# Patient Record
Sex: Female | Born: 1935 | Race: Black or African American | Hispanic: No | Smoking: Never smoker
Health system: Southern US, Community
[De-identification: ages and names within clinical notes are randomized; demographics above are authoritative.]

## PROBLEM LIST (undated history)

## (undated) DIAGNOSIS — I10 Essential (primary) hypertension: Secondary | ICD-10-CM

## (undated) DIAGNOSIS — F0390 Unspecified dementia without behavioral disturbance: Secondary | ICD-10-CM

## (undated) DIAGNOSIS — I509 Heart failure, unspecified: Secondary | ICD-10-CM

## (undated) DIAGNOSIS — E119 Type 2 diabetes mellitus without complications: Secondary | ICD-10-CM

---

## 2008-04-12 ENCOUNTER — Encounter: Admission: RE | Admit: 2008-04-12 | Discharge: 2008-04-12 | Payer: Self-pay | Admitting: Family Medicine

## 2008-09-04 ENCOUNTER — Emergency Department (HOSPITAL_COMMUNITY): Admission: EM | Admit: 2008-09-04 | Discharge: 2008-09-04 | Payer: Self-pay | Admitting: Emergency Medicine

## 2008-10-01 ENCOUNTER — Emergency Department (HOSPITAL_COMMUNITY): Admission: EM | Admit: 2008-10-01 | Discharge: 2008-10-01 | Payer: Self-pay | Admitting: Emergency Medicine

## 2008-11-11 ENCOUNTER — Observation Stay (HOSPITAL_COMMUNITY): Admission: EM | Admit: 2008-11-11 | Discharge: 2008-11-11 | Payer: Self-pay | Admitting: Emergency Medicine

## 2009-11-06 ENCOUNTER — Emergency Department (HOSPITAL_COMMUNITY): Admission: EM | Admit: 2009-11-06 | Discharge: 2009-11-06 | Payer: Self-pay | Admitting: Emergency Medicine

## 2009-11-12 ENCOUNTER — Encounter: Admission: RE | Admit: 2009-11-12 | Discharge: 2009-11-12 | Payer: Self-pay | Admitting: Family Medicine

## 2010-03-19 LAB — POCT I-STAT, CHEM 8
BUN: 13 mg/dL (ref 6–23)
Chloride: 100 mEq/L (ref 96–112)
Creatinine, Ser: 0.8 mg/dL (ref 0.4–1.2)
Glucose, Bld: 150 mg/dL — ABNORMAL HIGH (ref 70–99)
HCT: 41 % (ref 36.0–46.0)
Potassium: 3.3 mEq/L — ABNORMAL LOW (ref 3.5–5.1)

## 2010-04-10 LAB — HEMOGLOBIN A1C: Mean Plasma Glucose: 180 mg/dL

## 2010-04-10 LAB — POCT I-STAT, CHEM 8
Creatinine, Ser: 0.7 mg/dL (ref 0.4–1.2)
Glucose, Bld: 199 mg/dL — ABNORMAL HIGH (ref 70–99)
Hemoglobin: 13.6 g/dL (ref 12.0–15.0)
Sodium: 137 mEq/L (ref 135–145)
TCO2: 26 mmol/L (ref 0–100)

## 2010-04-10 LAB — BLOOD GAS, ARTERIAL
Acid-Base Excess: 1.1 mmol/L (ref 0.0–2.0)
Drawn by: 232811
O2 Content: 2 L/min
pCO2 arterial: 44.7 mmHg (ref 35.0–45.0)

## 2011-10-03 ENCOUNTER — Other Ambulatory Visit: Payer: Self-pay | Admitting: Family Medicine

## 2011-10-03 DIAGNOSIS — H579 Unspecified disorder of eye and adnexa: Secondary | ICD-10-CM

## 2011-10-09 ENCOUNTER — Inpatient Hospital Stay: Admission: RE | Admit: 2011-10-09 | Payer: Self-pay | Source: Ambulatory Visit

## 2011-12-03 ENCOUNTER — Ambulatory Visit
Admission: RE | Admit: 2011-12-03 | Discharge: 2011-12-03 | Disposition: A | Discharge status: Home / Self Care | Payer: No Typology Code available for payment source | Source: Ambulatory Visit | Attending: Family Medicine | Admitting: Family Medicine

## 2011-12-03 DIAGNOSIS — H579 Unspecified disorder of eye and adnexa: Secondary | ICD-10-CM

## 2011-12-17 ENCOUNTER — Other Ambulatory Visit: Payer: Self-pay | Admitting: Family Medicine

## 2011-12-17 ENCOUNTER — Other Ambulatory Visit: Payer: Self-pay

## 2011-12-17 ENCOUNTER — Ambulatory Visit
Admission: RE | Admit: 2011-12-17 | Discharge: 2011-12-17 | Disposition: A | Discharge status: Home / Self Care | Payer: No Typology Code available for payment source | Source: Ambulatory Visit | Attending: Family Medicine | Admitting: Family Medicine

## 2011-12-17 ENCOUNTER — Ambulatory Visit: Admission: RE | Admit: 2011-12-17 | Payer: No Typology Code available for payment source | Source: Ambulatory Visit

## 2011-12-17 DIAGNOSIS — H579 Unspecified disorder of eye and adnexa: Secondary | ICD-10-CM

## 2012-01-01 ENCOUNTER — Other Ambulatory Visit: Payer: Self-pay | Admitting: Ophthalmology

## 2015-05-21 ENCOUNTER — Other Ambulatory Visit: Payer: Self-pay | Admitting: Nurse Practitioner

## 2015-05-21 ENCOUNTER — Ambulatory Visit
Admission: RE | Admit: 2015-05-21 | Discharge: 2015-05-21 | Disposition: A | Discharge status: Home / Self Care | Payer: No Typology Code available for payment source | Source: Ambulatory Visit | Attending: Nurse Practitioner | Admitting: Nurse Practitioner

## 2015-05-21 DIAGNOSIS — M25532 Pain in left wrist: Secondary | ICD-10-CM

## 2016-10-24 ENCOUNTER — Encounter (HOSPITAL_COMMUNITY): Payer: Self-pay | Admitting: Emergency Medicine

## 2016-10-24 ENCOUNTER — Emergency Department (HOSPITAL_COMMUNITY)
Admission: EM | Admit: 2016-10-24 | Discharge: 2016-10-25 | Disposition: A | Discharge status: Home / Self Care | Payer: Medicaid Other | Attending: Emergency Medicine | Admitting: Emergency Medicine

## 2016-10-24 DIAGNOSIS — Z5321 Procedure and treatment not carried out due to patient leaving prior to being seen by health care provider: Secondary | ICD-10-CM | POA: Insufficient documentation

## 2016-10-24 DIAGNOSIS — Z041 Encounter for examination and observation following transport accident: Secondary | ICD-10-CM | POA: Insufficient documentation

## 2016-10-24 HISTORY — DX: Type 2 diabetes mellitus without complications: E11.9

## 2016-10-24 HISTORY — DX: Essential (primary) hypertension: I10

## 2016-10-24 HISTORY — DX: Unspecified dementia, unspecified severity, without behavioral disturbance, psychotic disturbance, mood disturbance, and anxiety: F03.90
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
## 2016-10-25 NOTE — ED Notes (Signed)
Pt not in lobby.  

## 2017-05-28 ENCOUNTER — Ambulatory Visit
Admission: RE | Admit: 2017-05-28 | Discharge: 2017-05-28 | Disposition: A | Discharge status: Home / Self Care | Payer: No Typology Code available for payment source | Source: Ambulatory Visit | Attending: Family Medicine | Admitting: Family Medicine

## 2017-05-28 ENCOUNTER — Other Ambulatory Visit: Payer: Self-pay

## 2017-05-28 ENCOUNTER — Other Ambulatory Visit: Payer: Self-pay | Admitting: Family Medicine

## 2017-05-28 DIAGNOSIS — R0602 Shortness of breath: Secondary | ICD-10-CM

## 2017-05-29 ENCOUNTER — Observation Stay (HOSPITAL_COMMUNITY)
Admission: EM | Admit: 2017-05-29 | Discharge: 2017-05-31 | Disposition: A | Discharge status: Home / Self Care | Payer: Medicare (Managed Care) | Attending: Internal Medicine | Admitting: Internal Medicine

## 2017-05-29 ENCOUNTER — Emergency Department (HOSPITAL_COMMUNITY): Payer: Medicare (Managed Care)

## 2017-05-29 ENCOUNTER — Other Ambulatory Visit: Payer: Self-pay

## 2017-05-29 ENCOUNTER — Encounter (HOSPITAL_COMMUNITY): Payer: Self-pay | Admitting: Emergency Medicine

## 2017-05-29 DIAGNOSIS — B9620 Unspecified Escherichia coli [E. coli] as the cause of diseases classified elsewhere: Secondary | ICD-10-CM | POA: Insufficient documentation

## 2017-05-29 DIAGNOSIS — I5033 Acute on chronic diastolic (congestive) heart failure: Secondary | ICD-10-CM | POA: Insufficient documentation

## 2017-05-29 DIAGNOSIS — J181 Lobar pneumonia, unspecified organism: Secondary | ICD-10-CM | POA: Diagnosis not present

## 2017-05-29 DIAGNOSIS — J90 Pleural effusion, not elsewhere classified: Secondary | ICD-10-CM | POA: Insufficient documentation

## 2017-05-29 DIAGNOSIS — E1159 Type 2 diabetes mellitus with other circulatory complications: Secondary | ICD-10-CM

## 2017-05-29 DIAGNOSIS — Z79899 Other long term (current) drug therapy: Secondary | ICD-10-CM | POA: Insufficient documentation

## 2017-05-29 DIAGNOSIS — I10 Essential (primary) hypertension: Secondary | ICD-10-CM

## 2017-05-29 DIAGNOSIS — R0902 Hypoxemia: Secondary | ICD-10-CM | POA: Diagnosis not present

## 2017-05-29 DIAGNOSIS — R509 Fever, unspecified: Secondary | ICD-10-CM | POA: Diagnosis present

## 2017-05-29 DIAGNOSIS — I110 Hypertensive heart disease with heart failure: Secondary | ICD-10-CM | POA: Insufficient documentation

## 2017-05-29 DIAGNOSIS — D638 Anemia in other chronic diseases classified elsewhere: Secondary | ICD-10-CM | POA: Insufficient documentation

## 2017-05-29 DIAGNOSIS — R0602 Shortness of breath: Secondary | ICD-10-CM

## 2017-05-29 DIAGNOSIS — E119 Type 2 diabetes mellitus without complications: Secondary | ICD-10-CM

## 2017-05-29 DIAGNOSIS — R0600 Dyspnea, unspecified: Secondary | ICD-10-CM

## 2017-05-29 DIAGNOSIS — R262 Difficulty in walking, not elsewhere classified: Secondary | ICD-10-CM | POA: Insufficient documentation

## 2017-05-29 DIAGNOSIS — R0603 Acute respiratory distress: Secondary | ICD-10-CM | POA: Diagnosis present

## 2017-05-29 DIAGNOSIS — N390 Urinary tract infection, site not specified: Secondary | ICD-10-CM | POA: Diagnosis not present

## 2017-05-29 DIAGNOSIS — F0390 Unspecified dementia without behavioral disturbance: Secondary | ICD-10-CM | POA: Diagnosis not present

## 2017-05-29 DIAGNOSIS — F0150 Vascular dementia without behavioral disturbance: Secondary | ICD-10-CM | POA: Diagnosis not present

## 2017-05-29 DIAGNOSIS — I509 Heart failure, unspecified: Secondary | ICD-10-CM

## 2017-05-29 DIAGNOSIS — J209 Acute bronchitis, unspecified: Principal | ICD-10-CM | POA: Insufficient documentation

## 2017-05-29 HISTORY — DX: Acute respiratory distress: R06.03

## 2017-05-29 HISTORY — DX: Heart failure, unspecified: I50.9

## 2017-05-29 LAB — CBC WITH DIFFERENTIAL/PLATELET
ABS IMMATURE GRANULOCYTES: 0 10*3/uL (ref 0.0–0.1)
BASOS ABS: 0 10*3/uL (ref 0.0–0.1)
Basophils Relative: 0 %
EOS PCT: 1 %
Eosinophils Absolute: 0.1 10*3/uL (ref 0.0–0.7)
HEMATOCRIT: 32.2 % — AB (ref 36.0–46.0)
HEMOGLOBIN: 10.1 g/dL — AB (ref 12.0–15.0)
Immature Granulocytes: 1 %
LYMPHS PCT: 18 %
Lymphs Abs: 1.1 10*3/uL (ref 0.7–4.0)
MCH: 25.5 pg — AB (ref 26.0–34.0)
MCHC: 31.4 g/dL (ref 30.0–36.0)
MCV: 81.3 fL (ref 78.0–100.0)
MONO ABS: 1.3 10*3/uL — AB (ref 0.1–1.0)
MONOS PCT: 21 %
NEUTROS ABS: 3.6 10*3/uL (ref 1.7–7.7)
Neutrophils Relative %: 59 %
Platelets: 152 10*3/uL (ref 150–400)
RBC: 3.96 MIL/uL (ref 3.87–5.11)
RDW: 14.9 % (ref 11.5–15.5)
WBC: 6 10*3/uL (ref 4.0–10.5)

## 2017-05-29 LAB — URINALYSIS, ROUTINE W REFLEX MICROSCOPIC
BILIRUBIN URINE: NEGATIVE
Glucose, UA: NEGATIVE mg/dL
KETONES UR: NEGATIVE mg/dL
Nitrite: NEGATIVE
PROTEIN: NEGATIVE mg/dL
SPECIFIC GRAVITY, URINE: 1.018 (ref 1.005–1.030)
pH: 5 (ref 5.0–8.0)

## 2017-05-29 LAB — COMPREHENSIVE METABOLIC PANEL
ALBUMIN: 3 g/dL — AB (ref 3.5–5.0)
ALT: 7 U/L — AB (ref 14–54)
AST: 18 U/L (ref 15–41)
Alkaline Phosphatase: 60 U/L (ref 38–126)
Anion gap: 7 (ref 5–15)
BILIRUBIN TOTAL: 0.9 mg/dL (ref 0.3–1.2)
BUN: 13 mg/dL (ref 6–20)
CHLORIDE: 106 mmol/L (ref 101–111)
CO2: 28 mmol/L (ref 22–32)
CREATININE: 1.01 mg/dL — AB (ref 0.44–1.00)
Calcium: 8.2 mg/dL — ABNORMAL LOW (ref 8.9–10.3)
GFR calc Af Amer: 58 mL/min — ABNORMAL LOW (ref >60)
GFR, EST NON AFRICAN AMERICAN: 50 mL/min — AB (ref >60)
GLUCOSE: 121 mg/dL — AB (ref 65–99)
POTASSIUM: 3.8 mmol/L (ref 3.5–5.1)
Sodium: 141 mmol/L (ref 135–145)
Total Protein: 7 g/dL (ref 6.5–8.1)

## 2017-05-29 LAB — TROPONIN I

## 2017-05-29 LAB — PROCALCITONIN: Procalcitonin: <0.1 ng/mL

## 2017-05-29 LAB — INFLUENZA PANEL BY PCR (TYPE A & B)
INFLBPCR: NEGATIVE
Influenza A By PCR: NEGATIVE

## 2017-05-29 LAB — I-STAT CG4 LACTIC ACID, ED: LACTIC ACID, VENOUS: 0.66 mmol/L (ref 0.5–1.9)

## 2017-05-29 LAB — PROTIME-INR
INR: 1.19
Prothrombin Time: 15 seconds (ref 11.4–15.2)

## 2017-05-29 LAB — CBG MONITORING, ED
GLUCOSE-CAPILLARY: 116 mg/dL — AB (ref 65–99)
GLUCOSE-CAPILLARY: 91 mg/dL (ref 65–99)

## 2017-05-29 LAB — GLUCOSE, CAPILLARY: Glucose-Capillary: 124 mg/dL — ABNORMAL HIGH (ref 65–99)

## 2017-05-29 LAB — BRAIN NATRIURETIC PEPTIDE: B NATRIURETIC PEPTIDE 5: 65.8 pg/mL (ref 0.0–100.0)

## 2017-05-29 MED ORDER — LORAZEPAM 1 MG PO TABS
1.0000 mg | ORAL_TABLET | Freq: Every day | ORAL | Status: DC
  Administered 2017-05-29 – 2017-05-30 (×2): 1 mg via ORAL
  Filled 2017-05-29 (×2): qty 1

## 2017-05-29 MED ORDER — SODIUM CHLORIDE 0.9 % IV SOLN
2.0000 g | INTRAVENOUS | Status: DC
  Administered 2017-05-29: 2 g via INTRAVENOUS
  Filled 2017-05-29: qty 2

## 2017-05-29 MED ORDER — IPRATROPIUM-ALBUTEROL 0.5-2.5 (3) MG/3ML IN SOLN: 3.0000 mL | Freq: Four times a day (QID) | RESPIRATORY_TRACT | Status: DC | PRN

## 2017-05-29 MED ORDER — ACETAMINOPHEN 325 MG PO TABS: 650.0000 mg | ORAL_TABLET | Freq: Four times a day (QID) | ORAL | Status: DC | PRN

## 2017-05-29 MED ORDER — ONDANSETRON HCL 4 MG/2ML IJ SOLN: 4.0000 mg | Freq: Four times a day (QID) | INTRAMUSCULAR | Status: DC | PRN

## 2017-05-29 MED ORDER — ONDANSETRON HCL 4 MG PO TABS: 4.0000 mg | ORAL_TABLET | Freq: Four times a day (QID) | ORAL | Status: DC | PRN

## 2017-05-29 MED ORDER — ACETAMINOPHEN 650 MG RE SUPP: 650.0000 mg | Freq: Four times a day (QID) | RECTAL | Status: DC | PRN

## 2017-05-29 MED ORDER — FUROSEMIDE 20 MG PO TABS
20.0000 mg | ORAL_TABLET | Freq: Every day | ORAL | Status: DC
  Administered 2017-05-30 – 2017-05-31 (×2): 20 mg via ORAL
  Filled 2017-05-29 (×2): qty 1

## 2017-05-29 MED ORDER — SALINE SPRAY 0.65 % NA SOLN
1.0000 | Freq: Two times a day (BID) | NASAL | Status: DC
  Administered 2017-05-29 – 2017-05-31 (×4): 1 via NASAL
  Filled 2017-05-29 (×2): qty 44

## 2017-05-29 MED ORDER — DOXYCYCLINE HYCLATE 100 MG PO TABS
100.0000 mg | ORAL_TABLET | Freq: Two times a day (BID) | ORAL | Status: DC
  Administered 2017-05-30: 100 mg via ORAL
  Filled 2017-05-29: qty 1

## 2017-05-29 MED ORDER — IPRATROPIUM-ALBUTEROL 0.5-2.5 (3) MG/3ML IN SOLN
3.0000 mL | Freq: Once | RESPIRATORY_TRACT | Status: AC
  Administered 2017-05-29: 3 mL via RESPIRATORY_TRACT
  Filled 2017-05-29: qty 3

## 2017-05-29 MED ORDER — INSULIN ASPART 100 UNIT/ML ~~LOC~~ SOLN: 0.0000 [IU] | Freq: Three times a day (TID) | SUBCUTANEOUS | Status: DC

## 2017-05-29 MED ORDER — ALBUTEROL SULFATE (2.5 MG/3ML) 0.083% IN NEBU: 2.5000 mg | INHALATION_SOLUTION | RESPIRATORY_TRACT | Status: DC | PRN

## 2017-05-29 MED ORDER — SENNOSIDES-DOCUSATE SODIUM 8.6-50 MG PO TABS: 1.0000 | ORAL_TABLET | Freq: Every evening | ORAL | Status: DC | PRN

## 2017-05-29 MED ORDER — GUAIFENESIN ER 600 MG PO TB12: 600.0000 mg | ORAL_TABLET | Freq: Two times a day (BID) | ORAL | Status: DC | PRN

## 2017-05-29 MED ORDER — ENOXAPARIN SODIUM 40 MG/0.4ML ~~LOC~~ SOLN
40.0000 mg | Freq: Every day | SUBCUTANEOUS | Status: DC
  Administered 2017-05-29 – 2017-05-31 (×3): 40 mg via SUBCUTANEOUS
  Filled 2017-05-29 (×4): qty 0.4

## 2017-05-29 MED ORDER — BISACODYL 10 MG RE SUPP: 10.0000 mg | Freq: Every day | RECTAL | Status: DC | PRN

## 2017-05-29 MED ORDER — BENAZEPRIL HCL 5 MG PO TABS
20.0000 mg | ORAL_TABLET | Freq: Every day | ORAL | Status: DC
  Administered 2017-05-30 – 2017-05-31 (×2): 20 mg via ORAL
  Filled 2017-05-29 (×2): qty 4

## 2017-05-29 MED ORDER — CITALOPRAM HYDROBROMIDE 20 MG PO TABS
20.0000 mg | ORAL_TABLET | Freq: Every day | ORAL | Status: DC
  Administered 2017-05-29 – 2017-05-31 (×3): 20 mg via ORAL
  Filled 2017-05-29 (×3): qty 1

## 2017-05-29 MED ORDER — SODIUM CHLORIDE 0.9 % IV SOLN: INTRAVENOUS | Status: DC

## 2017-05-29 MED ORDER — VANCOMYCIN HCL 10 G IV SOLR
1500.0000 mg | Freq: Once | INTRAVENOUS | Status: AC
  Administered 2017-05-29: 1500 mg via INTRAVENOUS
  Filled 2017-05-29: qty 1500

## 2017-05-29 MED ORDER — VANCOMYCIN HCL IN DEXTROSE 1-5 GM/200ML-% IV SOLN: 1000.0000 mg | INTRAVENOUS | Status: DC

## 2017-05-29 NOTE — ED Notes (Signed)
Mittens placed on hands to discourage pulling of lines, IV. Pt calm, resting at present.

## 2017-05-29 NOTE — ED Notes (Signed)
Johny Chess (daughter) [phone] - contact first Synthia Fairbank (nephew) [phone] Please contact with any questions

## 2017-05-29 NOTE — Evaluation (Signed)
Physical Therapy Evaluation Patient Details Name: Amanda Stanton MRN: [id number] DOB: [birth year] Today's Date: 05/29/2017   History of Present Illness  Amanda Stanton is an 84yo black female who comes to [facility] on 5/23 after noted increased WOB by family as well as subjective fever. In ED noted febrile. PMH: advanced dementia language deficits, CHF, HTN. Pt has been a [facility] participant for several years, there 5D/week, still AMB home and stairs ad lib without supervision at home to get to second floor bedroom.    Clinical Impression  Pt admitted with above diagnosis. Pt currently with functional limitations due to the deficits listed below (see "PT Problem List"). Patient familiar to author from [facility] 5YA. Upon entry, the patient is received semirecumbent in bed, asleep, easily awakened with gentle touch. No family/caregiver present, but daughter is reach via telephone who provides details regarding patient history. Pt has severe language deficits at baseline, today responds well to physical initiation, tactile and visual cues for communication; minimal but intermittent eye contact and smiling, not observed grimacing at anytime. Pt participates with bed mobility and transfers with minA or less, mostly tactile cuing, appears more effortful than her described baseline. AMB /stairs not pursued d/t DB drop supine to sitting. Pt has adequate caregiver services at home and in community and would thrive there to address acute deficits: ability to access her 2nd story bedroom and bath is the biggest limiting factor and rationale for SNF recommendations at DC. Pt will benefit from skilled PT intervention to increase independence and safety with basic mobility in preparation for discharge to the venue listed below.       Follow Up Recommendations Supervision/Assistance - 24 hour;Supervision for mobility/OOB;SNF(Pt's bed/bath is on 2nd floor, full flight. This will dictate DC location more than  anything, otherwise would thrive with home/PACE)    Equipment Recommendations  None recommended by PT    Recommendations for Other Services       Precautions / Restrictions Precautions Precautions: Fall Precaution Comments: advanced dementia at baseline Restrictions Weight Bearing Restrictions: No      Mobility  Bed Mobility Overal bed mobility: Needs Assistance Bed Mobility: Supine to Sit;Sit to Supine     Supine to sit: Min assist Sit to supine: Min assist   General bed mobility comments: requires iniitation of movement for communication purposes, but then becomes engaged adn moves well, some weakness, additional effort noted.   Transfers Overall transfer level: Needs assistance Equipment used: None Transfers: Sit to/from Stand Sit to Stand: Min assist;Min guard(doesn't rise with 2-hand hodl assist, but with dependent transfer postutring rises easily, no significan tLOB. )         General transfer comment: requires iniitation of movement for communication purposes, but then becomes engaged adn moves well, some weakness, additional effort noted.   Ambulation/Gait Ambulation/Gait assistance: (not attempted; BP drop from supine to sitting EOB. )              Stairs            Wheelchair Mobility    Modified Rankin (Stroke Patients Only)       Balance Overall balance assessment: Mild deficits observed, not formally tested;Needs assistance                                           Pertinent Vitals/Pain Pain Assessment: Faces Faces Pain Scale: Hurts  a little bit(appears gebneraally malaised, returns covers after PT removes them. ) Pain Intervention(s): Limited activity within patient's tolerance;Monitored during session;Repositioned    Home Living Family/patient expects to be discharged to:: Private residence Living Arrangements: Children Available Help at Discharge: Family(Pt is a PACE participant M-F, has an aide M-F in home  3-7pm, aide Sat 8a-10a, 1p-5p, Sun 8a-12p   ) Type of Home: Apartment Home Access: Level entry     Home Layout: Two level Home Equipment: Shower seat - built in      Prior Function           Comments: limited community AMB without AD, climbs a flight of stairs ad lib; working with PT at [employer]; requires assistance with bathing, dressing, toiletting. Can self feed. Significant communication deficits.      Hand Dominance        Extremity/Trunk Assessment   Upper Extremity Assessment Upper Extremity Assessment: Generalized weakness    Lower Extremity Assessment Lower Extremity Assessment: Generalized weakness       Communication   Communication: Receptive difficulties;Expressive difficulties  Cognition Arousal/Alertness: Awake/alert Behavior During Therapy: WFL for tasks assessed/performed Overall Cognitive Status: History of cognitive impairments - at baseline(severe languarge deficits, reponds well to tactile consoling, and physical cues to inititate activity, some visual cues helpful <25% of times. )                                        General Comments      Exercises     Assessment/Plan    PT Assessment Patient needs continued PT services  PT Problem List Decreased strength;Decreased activity tolerance;Decreased mobility       PT Treatment Interventions Therapeutic activities    PT Goals (Current goals can be found in the Care Plan section)  Acute Rehab PT Goals PT Goal Formulation: Patient unable to participate in goal setting Time For Goal Achievement: 06/12/17    Frequency Min 2X/week   Barriers to discharge   pt has caregiver support nearly if not 24/7    Co-evaluation               AM-PAC PT "6 Clicks" Daily Activity  Outcome Measure Difficulty turning over in bed (including adjusting bedclothes, sheets and blankets)?: A Lot Difficulty moving from lying on back to sitting on the side of the bed? : A Lot Difficulty  sitting down on and standing up from a chair with arms (e.g., wheelchair, bedside commode, etc,.)?: Unable Help needed moving to and from a bed to chair (including a wheelchair)?: A Lot Help needed walking in hospital room?: A Lot Help needed climbing 3-5 steps with a railing? : Total 6 Click Score: 10    End of Session Equipment Utilized During Treatment: (pt doffing nasal canual) Activity Tolerance: Patient tolerated treatment well;Patient limited by fatigue;Treatment limited secondary to medical complications (Comment)(BP drop to 73/62 at EOB) Patient left: in bed;with bed alarm set Nurse Communication: Mobility status(baseline funcitonal status) PT Visit Diagnosis: Difficulty in walking, not elsewhere classified (R26.2);Muscle weakness (generalized) (M62.81)    Time: 0981-1914 PT Time Calculation (min) (ACUTE ONLY): 24 min   Charges:   PT Evaluation $PT Eval Moderate Complexity: 1 Mod PT Treatments $Therapeutic Activity: 8-22 mins   PT G Codes:        2:57 PM, 2017-06-24 Rosamaria Lints, PT, DPT Physical Therapist - [place] [phone] (Pager)  [phone] (Office)  Kariana Wiles C 05/29/2017, 2:52 PM

## 2017-05-29 NOTE — ED Notes (Signed)
Attempted to call patient's daughter [name] to update her that patient will be moving to [facility], no answer at number provided, HIPPA compliant voicemail left for pts daughter to contact this RN.

## 2017-05-29 NOTE — H&P (Addendum)
History and Physical    Amanda Stanton ZOX:[id number] DOB: 1931-05-03 DOA: 05/29/2017   PCP: Jethro Bastos, MD   Patient coming from:  Home    Chief Complaint: Shortness of breath   HPI: Amanda Stanton is a 84 y.o. female with medical history significant for dementia, congestive heart failure, hypertension, dementia, withbaseline confusion, brought by EMS with increasing shortness of breath.  She is level 5 caveat due to increased confusion, unable to follow commands or verbalize symptoms.  History is obtained by daughter, who reports that, over the last several days, the patient was having increased shortness of breath, and nonproductive cough.  At home, she was found to have a fever of 102.9, with similar values at the ED.  Daughter denies the patient having any sick contacts.  She did not appear to have any chills or night sweats.  She did have mild decrease oral intake this week.  Denies any nausea or vomiting.  No diarrhea was noted.  Daughter denies the patient having any decreased urine output, or lower extremity swelling.   ED Course:  BP 102/60   Pulse 89   Temp (!) 100.7 F (38.2 C) (Axillary)   Resp 20   Ht  (1.6 m)   Wt 68 kg (150 lb)   SpO2 93%   BMI 26.57 kg/m   Chest x-ray shows cardiomegaly with vascular congestion and interstitial opacity suspicious for minimal edema. Sodium 141, potassium 3.8, bicarb 28, glucose 121, BUN 13, creatinine 1.01, calcium 8.2, LFTs normal. BNP 65.8, lactic acid 0.66 White count 6, hemoglobin 10.1, platelets 152 PT 15, INR 1.19 Cultures pending Patient was placed on Vanco and Maxipime per pharmacy  Review of Systems:  As per HPI otherwise all other systems reviewed and are negative as reported by daughter due to Level 5 caveat due to dementia   Past Medical History:  Diagnosis Date  . CHF (congestive heart failure) (HCC)   . Dementia   . Diabetes mellitus without complication (HCC)   . Hypertension     History reviewed. No  pertinent surgical history.  Social History Social History   Socioeconomic History  . Marital status: Divorced    Spouse name: Not on file  . Number of children: Not on file  . Years of education: Not on file  . Highest education level: Not on file  Occupational History  . Not on file  Social Needs  . Financial resource strain: Not on file  . Food insecurity:    Worry: Not on file    Inability: Not on file  . Transportation needs:    Medical: Not on file    Non-medical: Not on file  Tobacco Use  . Smoking status: Never Smoker  . Smokeless tobacco: Never Used  Substance and Sexual Activity  . Alcohol use: No  . Drug use: Not on file  . Sexual activity: Not on file  Lifestyle  . Physical activity:    Days per week: Not on file    Minutes per session: Not on file  . Stress: Not on file  Relationships  . Social connections:    Talks on phone: Not on file    Gets together: Not on file    Attends religious service: Not on file    Active member of club or organization: Not on file    Attends meetings of clubs or organizations: Not on file    Relationship status: Not on file  . Intimate partner violence:  Fear of current or ex partner: Not on file    Emotionally abused: Not on file    Physically abused: Not on file    Forced sexual activity: Not on file  Other Topics Concern  . Not on file  Social History Narrative  . Not on file     No Known Allergies  History reviewed. No pertinent family history.    Prior to Admission medications   Not on File    Physical Exam:  Vitals:   05/29/17 0645 05/29/17 0706 05/29/17 0708 05/29/17 0708  BP: 102/63  102/60   Pulse: 85 88 89   Resp:  20    Temp:    (!) 100.7 F (38.2 C)  TempSrc:    Axillary  SpO2: 95% 97% 93%   Weight:      Height:       Constitutional: NAD, calm, comfortable,  Eyes: PERRL, lids and conjunctivae normal ENMT: Mucous membranes are moist, without exudate or lesions. edentulous Neck:  normal, supple, no masses, no thyromegaly Respiratory:   Slight decreased breath sounds at the bases, no wheezing,  Normal respiratory effort  Cardiovascular: Regular rate and rhythm, 2/6  murmur, rubs or gallops. No extremity edema. 2+ pedal pulses. No carotid bruits.  Abdomen: Soft, non tender, No hepatosplenomegaly. Bowel sounds positive.  Musculoskeletal: no clubbing / cyanosis. Moves all extremities. Wearing mittens  Skin: no jaundice, No lesions.  Neurologic: Sensation intact  Strength unable to be tested due to dementia  Psychiatric:   Awake, known dementia, not following commands     Labs on Admission: I have personally reviewed following labs and imaging studies  CBC: Recent Labs  Lab 05/29/17 0327  WBC 6.0  NEUTROABS 3.6  HGB 10.1*  HCT 32.2*  MCV 81.3  PLT 152    Basic Metabolic Panel: Recent Labs  Lab 05/29/17 0327  NA 141  K 3.8  CL 106  CO2 28  GLUCOSE 121*  BUN 13  CREATININE 1.01*  CALCIUM 8.2*    GFR: Estimated Creatinine Clearance: 39 mL/min (A) (by C-G formula based on SCr of 1.01 mg/dL (H)).  Liver Function Tests: Recent Labs  Lab 05/29/17 0327  AST 18  ALT 7*  ALKPHOS 60  BILITOT 0.9  PROT 7.0  ALBUMIN 3.0*   No results for input(s): LIPASE, AMYLASE in the last 168 hours. No results for input(s): AMMONIA in the last 168 hours.  Coagulation Profile: Recent Labs  Lab 05/29/17 0327  INR 1.19    Cardiac Enzymes: Recent Labs  Lab 05/29/17 0531  TROPONINI <0.03    BNP (last 3 results) No results for input(s): PROBNP in the last 8760 hours.  HbA1C: No results for input(s): HGBA1C in the last 72 hours.  CBG: No results for input(s): GLUCAP in the last 168 hours.  Lipid Profile: No results for input(s): CHOL, HDL, LDLCALC, TRIG, CHOLHDL, LDLDIRECT in the last 72 hours.  Thyroid Function Tests: No results for input(s): TSH, T4TOTAL, FREET4, T3FREE, THYROIDAB in the last 72 hours.  Anemia Panel: No results for input(s):  VITAMINB12, FOLATE, FERRITIN, TIBC, IRON, RETICCTPCT in the last 72 hours.  Urine analysis: No results found for: COLORURINE, APPEARANCEUR, LABSPEC, PHURINE, GLUCOSEU, HGBUR, BILIRUBINUR, KETONESUR, PROTEINUR, UROBILINOGEN, NITRITE, LEUKOCYTESUR  Sepsis Labs: (procalcitonin:4,lacticidven:4) )No results found for this or any previous visit (from the past 240 hour(s)).   Radiological Exams on Admission: Dg Chest 2 View  Result Date: 05/29/2017 CLINICAL DATA:  Shortness of breath EXAM: CHEST - 2 VIEW COMPARISON:  05/28/2017 11/11/2008 FINDINGS: No pleural effusion. Mild cardiomegaly with vascular congestion. Mild diffuse interstitial opacities suspicious for minimal edema. No pneumothorax. IMPRESSION: Cardiomegaly with vascular congestion and interstitial opacity suspicious for minimal edema. Electronically Signed   By: Jasmine Pang M.D.   On: 05/29/2017 04:09   Dg Chest 2 View  Result Date: 05/28/2017 CLINICAL DATA:  Cough and shortness of breath EXAM: CHEST - 2 VIEW COMPARISON:  11/11/2008 FINDINGS: Cardiac shadow is stable. Lungs are well aerated bilaterally. Mild vascular congestion is noted without focal confluent infiltrate. Minimal left pleural effusion is noted. IMPRESSION: Changes consistent with mild CHF. Electronically Signed   By: Alcide Clever M.D.   On: 05/28/2017 12:04    EKG: Independently reviewed.  Assessment/Plan Principal Problem:   Acute respiratory distress Active Problems:   Fever   Hypertension   Dementia   DM (diabetes mellitus) (HCC)   CHF (congestive heart failure) (HCC)   Acute Respiratory distress without Hypoxia likely due to early pneumonia (CAP vs Aspiration)  CURB 65 1 Presenting now with ongoing / progressive symptoms including none productive cough, fever up to 102.9, currently 100.7. Non toxic appearing . Chest x-ray shows cardiomegaly with vascular congestion and interstitial opacity suspicious for minimal edema. BNP normal.  UA is pending.   WBC 6 Lactic acid normal 0.66, Bicarb 2 8. Cultures pending. Received Vanc and Cefepime, O2 and small IVF bolus  Admit to Medsurg Obs  Continue O2 at 2 L [place]  for comfort  sputum cultures  IV antibiotics with per protocol with Vanco and Maxipime cultures return Procalcitonin,Strep pneumo urine antigen, influenza panel is pending Nebulizers as needed, with Duoneb q 6 h prn and Albuterol q 2 h prn Mucinex prn  Antipyretics prn Repeat CBC in am      Hypertension BP 102/60   Pulse 89  Reconciation still pending, unknown if the patient is on any meds.  Follow-up, but if patient is on BP meds, will hold and at this time, as her pressure is on the lower side   Diastolic CHF: No acute decompensation weight 150 lbs. BNP 65.8  No recent Echo available for review    Obtain daily weights Monitor intake and output  Anemia of chronic disease Hemoglobin on admission 10.1, no recent labs to compare. No acute bleeding issues  Repeat CBC in am  No transfusion is indicated at this time   Type II Diabetes Current blood sugar level is 121 Lab Results  Component Value Date   HGBA1C (H) 11/10/2008    7.9 (NOTE) The ADA recommends the following therapeutic goal for glycemic control related to Hgb A1c measurement: Goal of therapy: <6.5 Hgb A1c  Reference: American Diabetes Association: Clinical Practice Recommendations 2010, Diabetes Care, 2010, 33: (Suppl  1).  SSI  History of Dementia No acute issues, chronic confusion  Med rec is pending at this time, will addendum once becomes available.   DVT prophylaxis:  Lovenox Code Status:    Full after discussion with daughter Family Communication:  Discussed with patient's daughter Disposition Plan: Expect patient to be discharged to home after condition improves Consults called:    None  Admission status:  Medsurg obs   Marlowe Kays, PA-C [facility]   Amion text  [phone]   05/29/2017, 7:36 AM

## 2017-05-29 NOTE — ED Notes (Signed)
Windell Moulding, RN updated that PA [facility] advised she would order regular diet order for patient and that she did still want staff to try and obtain urine on patient.

## 2017-05-29 NOTE — ED Notes (Signed)
Blood cultures collected.

## 2017-05-29 NOTE — ED Notes (Signed)
PA [facility] paged regarding a diet order for patient, this RN also inquired if urine collection is still needed since provider told pts family we did not need to in/out cath her at this time.

## 2017-05-29 NOTE — Progress Notes (Addendum)
1.Lactic acid and Procalcitonin are negative, will dc Vanc andCefepime, but will switch to Azithromycin for coverage in am. Will await for culture results and determine the need for antibiotics.   2. Will Hold her antihypertensives till tomorrow due to soft BP

## 2017-05-29 NOTE — ED Provider Notes (Signed)
[facility] EMERGENCY DEPARTMENT Provider Note   CSN: [id number] Arrival date & time: 05/29/17  0256     History   Chief Complaint Chief Complaint  Patient presents with  . Shortness of Breath    Level 5 caveat: Dementia  HPI Amanda Stanton is a 84 y.o. female.  HPI 84 year old female with history of diabetes and congestive heart failure brought to the emergency department for increasing shortness of breath per family and family reports cough over the past several days and that patient was found to have a fever of 102.9 on arrival to the emergency department.  No vomiting.  Mild decreased oral intake this week.  No documented fever prior to evaluation in the emergency department.  She lives at home with her daughter who provides care for her.   Past Medical History:  Diagnosis Date  . CHF (congestive heart failure) (HCC)   . Dementia   . Diabetes mellitus without complication (HCC)   . Hypertension     Patient Active Problem List   Diagnosis Date Noted  . Hypertension 05/29/2017  . Dementia 05/29/2017  . DM (diabetes mellitus) (HCC) 05/29/2017  . CHF (congestive heart failure) (HCC) 05/29/2017  . Sepsis (HCC) 05/29/2017    History reviewed. No pertinent surgical history.   OB History   None      Home Medications    Prior to Admission medications   Not on File    Family History History reviewed. No pertinent family history.  Social History Social History   Tobacco Use  . Smoking status: Never Smoker  . Smokeless tobacco: Never Used  Substance Use Topics  . Alcohol use: No  . Drug use: Not on file     Allergies   Patient has no known allergies.   Review of Systems Review of Systems  Unable to perform ROS: Dementia     Physical Exam Updated Vital Signs BP 102/63   Pulse 85   Temp (!) 102.9 F (39.4 C) (Oral)   Resp 20   Ht  (1.6 m)   Wt 68 kg (150 lb)   SpO2 95%   BMI 26.57 kg/m   Physical Exam    Constitutional: She is oriented to person, place, and time. She appears well-developed and well-nourished. No distress.  HENT:  Head: Normocephalic and atraumatic.  Eyes: EOM are normal.  Neck: Normal range of motion.  Cardiovascular: Normal rate, regular rhythm and normal heart sounds.  Pulmonary/Chest: Effort normal and breath sounds normal.  Abdominal: Soft. She exhibits no distension. There is no tenderness.  Musculoskeletal: Normal range of motion.       Right lower leg: Normal. She exhibits no edema.       Left lower leg: Normal. She exhibits no edema.  Neurological: She is alert and oriented to person, place, and time.  Skin: Skin is warm and dry.  Psychiatric: She has a normal mood and affect. Judgment normal.  Nursing note and vitals reviewed.    ED Treatments / Results  Labs (all labs ordered are listed, but only abnormal results are displayed) Labs Reviewed  COMPREHENSIVE METABOLIC PANEL - Abnormal; Notable for the following components:      Result Value   Glucose, Bld 121 (*)    Creatinine, Ser 1.01 (*)    Calcium 8.2 (*)    Albumin 3.0 (*)    ALT 7 (*)    GFR calc non Af Amer 50 (*)    GFR calc Af Amer 58 (*)  All other components within normal limits  CBC WITH DIFFERENTIAL/PLATELET - Abnormal; Notable for the following components:   Hemoglobin 10.1 (*)    HCT 32.2 (*)    MCH 25.5 (*)    Monocytes Absolute 1.3 (*)    All other components within normal limits  CULTURE, BLOOD (ROUTINE X 2)  CULTURE, BLOOD (ROUTINE X 2)  URINE CULTURE  PROTIME-INR  BRAIN NATRIURETIC PEPTIDE  TROPONIN I  URINALYSIS, ROUTINE W REFLEX MICROSCOPIC  I-STAT CG4 LACTIC ACID, ED  I-STAT CG4 LACTIC ACID, ED    EKG None  Radiology Dg Chest 2 View  Result Date: 05/29/2017 CLINICAL DATA:  Shortness of breath EXAM: CHEST - 2 VIEW COMPARISON:  05/28/2017 11/11/2008 FINDINGS: No pleural effusion. Mild cardiomegaly with vascular congestion. Mild diffuse interstitial opacities  suspicious for minimal edema. No pneumothorax. IMPRESSION: Cardiomegaly with vascular congestion and interstitial opacity suspicious for minimal edema. Electronically Signed   By: Jasmine Pang M.D.   On: 05/29/2017 04:09      Procedures Procedures (including critical care time)  Medications Ordered in ED Medications  vancomycin (VANCOCIN) 1,500 mg in sodium chloride 0.9 % 500 mL IVPB (1,500 mg Intravenous New Bag/Given 05/29/17 0654)  ceFEPIme (MAXIPIME) 2 g in sodium chloride 0.9 % 100 mL IVPB (0 g Intravenous Stopped 05/29/17 0624)  vancomycin (VANCOCIN) IVPB 1000 mg/200 mL premix (has no administration in time range)  ipratropium-albuterol (DUONEB) 0.5-2.5 (3) MG/3ML nebulizer solution 3 mL (has no administration in time range)  albuterol (PROVENTIL) (2.5 MG/3ML) 0.083% nebulizer solution 2.5 mg (has no administration in time range)  ipratropium-albuterol (DUONEB) 0.5-2.5 (3) MG/3ML nebulizer solution 3 mL (3 mLs Nebulization Given 05/29/17 0654)     Initial Impression / Assessment and Plan / ED Course  I have reviewed the triage vital signs and the nursing notes.  Pertinent labs & imaging results that were available during my care of the patient were reviewed by me and considered in my medical decision making (see chart for details).     Clinically with cough for several days and fever here this is likely developing pneumonia.  Broad-spectrum antibiotic's now.  Blood cultures.  Urine culture.  Patient will be watched overnight in the hospital.  Family updated.  Urine still pending.  Blood cultures pending.  Consultations: [facility]  Dispo: admission  Final Clinical Impressions(s) / ED Diagnoses   Final diagnoses:  Fever, unspecified fever cause  SOB (shortness of breath)    ED Discharge Orders    None       Azalia Bilis, MD 05/29/17 [id number]

## 2017-05-29 NOTE — ED Notes (Signed)
Patient transported to X-ray 

## 2017-05-29 NOTE — ED Triage Notes (Signed)
Pt BIB EMS from home c/o SOB. Pt hx of dementia with significant baseline confusion, unable to follow commands well or tolerate masks, lines, etc. At [facility] earlier today, where she was dx'd with CHF and given lasix to begin taking. EMS reports cough and temp of 100F.

## 2017-05-29 NOTE — ED Notes (Signed)
Pt daughter requesting that we hold off on collecting pt's urine in any way invasive, until she can return after taking family member home.

## 2017-05-29 NOTE — ED Notes (Signed)
PA Wertman advised pts family that patient may eat, this RN offered to order pt a breakfast tray, pts family stated they had some oatmeal they would give patient and requested applesauce

## 2017-05-29 NOTE — Evaluation (Signed)
Occupational Therapy Evaluation Patient Details Name: Amanda Stanton MRN: [id number] DOB: [birth year] Today's Date: 05/29/2017    History of Present Illness Kerrington Sova is an 84yo black female who comes to [facility] on 5/23 after noted increased WOB by family as well as subjective fever. In ED noted febrile. PMH: advanced dementia language deficits, CHF, HTN. Pt has been a [facility] participant for several years, there 5D/week, still AMB home and stairs ad lib without supervision at home to get to second floor bedroom.     Clinical Impression   This 84 y/o female presents with the above. Pt is a [facility] participant, per chart and PT report (who spoke with family), pt receives assist for almost all ADL completion, was completing self-feeding. Pt was also ambulating without AD or assist at her baseline. Pt completing bed mobility with MinA this session, maintaining static sitting with close minguard assist. Pt continues to require totalA for ADL completion this session, mostly due to cognition vs physical ability. Feel this may be close to pt's baseline ADL status. Will continue to follow while she remains in acute setting to maintain pt's overall functional mobility and participation with ADLs.     Follow Up Recommendations  Supervision/Assistance - 24 hour;Other (comment)(continue with [facility] services )    Equipment Recommendations  None recommended by OT           Precautions / Restrictions Precautions Precautions: Fall Precaution Comments: advanced dementia at baseline Restrictions Weight Bearing Restrictions: No      Mobility Bed Mobility Overal bed mobility: Needs Assistance Bed Mobility: Supine to Sit;Sit to Supine     Supine to sit: Min assist Sit to supine: Min assist   General bed mobility comments: requires iniitation of movement for communication purposes, but then becomes engaged and moves well, some weakness, additional effort noted; minA to initiate bringing LEs onto  EOB  Transfers Overall transfer level: Needs assistance Equipment used: None Transfers: Sit to/from Stand Sit to Stand: Min assist;Min guard(doesn't rise with 2-hand hodl assist, but with dependent transfer postutring rises easily, no significan tLOB. )         General transfer comment: requires iniitation of movement for communication purposes, but then becomes engaged adn moves well, some weakness, additional effort noted.     Balance Overall balance assessment: Needs assistance   Sitting balance-Leahy Scale: Fair                                     ADL either performed or assessed with clinical judgement   ADL Overall ADL's : Needs assistance/impaired                                       General ADL Comments: pt requiring total assist for simple grooming ADLs this session, performs bed mobility with minA and sat EOB during session with close minguard for static sitting balance; pts BP decreasing in sitting, initially 98/54 while supine in bed, 81/43 sitting EOB therefore did not attempt standing at this time; suspect pt may be at her baseline regarding ADL completion                          Pertinent Vitals/Pain Pain Assessment: Faces Faces Pain Scale: No hurt Pain Intervention(s): Monitored during session  Extremity/Trunk Assessment Upper Extremity Assessment Upper Extremity Assessment: Generalized weakness   Lower Extremity Assessment Lower Extremity Assessment: Generalized weakness       Communication Communication Communication: Receptive difficulties;Expressive difficulties   Cognition Arousal/Alertness: Awake/alert Behavior During Therapy: WFL for tasks assessed/performed Overall Cognitive Status: History of cognitive impairments - at baseline                                 General Comments: baseline dementia, severe language deficits    General Comments                  Home Living  Family/patient expects to be discharged to:: Private residence Living Arrangements: Children Available Help at Discharge: Family Type of Home: Apartment Home Access: Level entry     Home Layout: Two level Alternate Level Stairs-Number of Steps: 13 full flight to pt's bed room and full.  Alternate Level Stairs-Rails: Right     Bathroom Toilet: Standard     Home Equipment: Shower seat - built in          Prior Functioning/Environment Level of Independence: Needs assistance        Comments: limited community AMB without AD, climbs a flight of stairs ad lib; working with PT at [employer]; requires assistance with bathing, dressing, toiletting. Can self feed. Significant communication deficits.         OT Problem List: Decreased strength;Impaired balance (sitting and/or standing)      OT Treatment/Interventions: Self-care/ADL training;DME and/or AE instruction;Therapeutic activities;Balance training;Patient/family education    OT Goals(Current goals can be found in the care plan section) Acute Rehab OT Goals Patient Stated Goal: none stated at this time  OT Goal Formulation: Patient unable to participate in goal setting Time For Goal Achievement: 06/12/17 Potential to Achieve Goals: Fair  OT Frequency: Min 1X/week                             AM-PAC PT "6 Clicks" Daily Activity     Outcome Measure Help from another person eating meals?: A Lot Help from another person taking care of personal grooming?: Total Help from another person toileting, which includes using toliet, bedpan, or urinal?: Total Help from another person bathing (including washing, rinsing, drying)?: Total Help from another person to put on and taking off regular upper body clothing?: Total Help from another person to put on and taking off regular lower body clothing?: Total 6 Click Score: 7   End of Session Nurse Communication: Mobility status  Activity Tolerance: Patient tolerated treatment  well Patient left: in bed;with call bell/phone within reach;with bed alarm set  OT Visit Diagnosis: Muscle weakness (generalized) (M62.81)                Time: 1610-9604 OT Time Calculation (min): 13 min Charges:  OT General Charges $OT Visit: 1 Visit OT Evaluation $OT Eval Moderate Complexity: 1 Mod G-Codes:     Marcy Siren, OT Pager [phone] 05/29/2017   Orlando Penner 05/29/2017, 3:41 PM

## 2017-05-29 NOTE — ED Notes (Signed)
Pt's family walked up to staff grabbing vitals on hallway bed. Complained about patient being in the hallway. Also standing at window watching staffs comings and goings.

## 2017-05-29 NOTE — Progress Notes (Signed)
Pharmacy Antibiotic Note  Amanda Stanton is a 84 y.o. female admitted on 05/29/2017 with sepsis.  Pharmacy has been consulted for Vancomycin and cefepime dosing.  Plan: Vancomycin 1500 mg IV x 1 as loading dose, followed by Vancomycin 1000 mg IV every 24 hours.  Goal trough 15-20 mcg/mL. Cefepime 2 gram IV every 24 hours. Monitor clinical progress, cultures/sensitivities, renal function, abx plan Vancomycin trough as indicated   Height:  (160 cm) Weight: 150 lb (68 kg) IBW/kg (Calculated) : 52.4  Temp (24hrs), Avg:102.9 F (39.4 C), Min:102.9 F (39.4 C), Max:102.9 F (39.4 C)  Recent Labs  Lab 05/29/17 0327 05/29/17 0328  WBC 6.0  --   CREATININE 1.01*  --   LATICACIDVEN  --  0.66    Estimated Creatinine Clearance: 39 mL/min (A) (by C-G formula based on SCr of 1.01 mg/dL (H)).    No Known Allergies  Antimicrobials this admission: 5/24 vanc >>  5/24 cefepime >>   Dose adjustments this admission:  Microbiology results: 5/24 BCx: sent 5/24 UCx: sent    Thank you for allowing pharmacy to be a part of this patient's care.  Signe Colt, PharmD 05/29/2017 5:08 AM

## 2017-05-29 NOTE — ED Notes (Signed)
pts daughter stated she was going home to get some rest, requested that we contact her with any questions or info regarding patient.

## 2017-05-29 NOTE — ED Notes (Signed)
Pt ate oatmeal that family brought

## 2017-05-29 NOTE — Progress Notes (Signed)
PT Cancellation Note  Patient Details Name: Amanda Stanton MRN: [id number] DOB: 07-18-1931   Cancelled Treatment:    Reason Eval/Treat Not Completed: Other (comment)(Chart reviewed, RN consulted. RN report patient is at baseline mentation, advanced dementia, pleasant, not following commands, confused. PT made attempt to contact primary caregiver/daughter without answer, and mailbox full.) Will attempt to contact daughter [phone]) again at later date time to establish baseline functional information regarding the patient, home set-up, caregiver support, and DME availability.   1:44 PM, 05/29/17 Rosamaria Lints, PT, DPT Physical Therapist - [place] [phone] (Pager)  [phone] (Office)    Dontarious Schaum C 05/29/2017, 1:43 PM

## 2017-05-30 DIAGNOSIS — R0603 Acute respiratory distress: Secondary | ICD-10-CM

## 2017-05-30 DIAGNOSIS — I5033 Acute on chronic diastolic (congestive) heart failure: Secondary | ICD-10-CM | POA: Diagnosis not present

## 2017-05-30 LAB — BASIC METABOLIC PANEL
Anion gap: 8 (ref 5–15)
BUN: 14 mg/dL (ref 6–20)
CHLORIDE: 107 mmol/L (ref 101–111)
CO2: 25 mmol/L (ref 22–32)
CREATININE: 1.06 mg/dL — AB (ref 0.44–1.00)
Calcium: 8.4 mg/dL — ABNORMAL LOW (ref 8.9–10.3)
GFR calc non Af Amer: 47 mL/min — ABNORMAL LOW (ref >60)
GFR, EST AFRICAN AMERICAN: 55 mL/min — AB (ref >60)
Glucose, Bld: 124 mg/dL — ABNORMAL HIGH (ref 65–99)
POTASSIUM: 4.2 mmol/L (ref 3.5–5.1)
SODIUM: 140 mmol/L (ref 135–145)

## 2017-05-30 LAB — CBC
HEMATOCRIT: 30.7 % — AB (ref 36.0–46.0)
Hemoglobin: 9.7 g/dL — ABNORMAL LOW (ref 12.0–15.0)
MCH: 26 pg (ref 26.0–34.0)
MCHC: 31.6 g/dL (ref 30.0–36.0)
MCV: 82.3 fL (ref 78.0–100.0)
PLATELETS: 138 10*3/uL — AB (ref 150–400)
RBC: 3.73 MIL/uL — AB (ref 3.87–5.11)
RDW: 15.1 % (ref 11.5–15.5)
WBC: 7.3 10*3/uL (ref 4.0–10.5)

## 2017-05-30 LAB — GLUCOSE, CAPILLARY
GLUCOSE-CAPILLARY: 112 mg/dL — AB (ref 65–99)
GLUCOSE-CAPILLARY: 98 mg/dL (ref 65–99)
Glucose-Capillary: 108 mg/dL — ABNORMAL HIGH (ref 65–99)
Glucose-Capillary: 110 mg/dL — ABNORMAL HIGH (ref 65–99)

## 2017-05-30 MED ORDER — FUROSEMIDE 10 MG/ML IJ SOLN
20.0000 mg | Freq: Once | INTRAMUSCULAR | Status: AC
  Administered 2017-05-30: 20 mg via INTRAVENOUS

## 2017-05-30 MED ORDER — SODIUM CHLORIDE 0.9 % IV SOLN
500.0000 mg | INTRAVENOUS | Status: DC
  Administered 2017-05-30 – 2017-05-31 (×2): 500 mg via INTRAVENOUS
  Filled 2017-05-30 (×2): qty 500

## 2017-05-30 MED ORDER — SODIUM CHLORIDE 0.9 % IV SOLN
1.0000 g | INTRAVENOUS | Status: DC
  Administered 2017-05-30 – 2017-05-31 (×2): 1 g via INTRAVENOUS
  Filled 2017-05-30 (×2): qty 10

## 2017-05-30 NOTE — Clinical Social Work Note (Signed)
CSW spoke with [facility] RN. She said they would not approve SNF unless absolutely necessary.  Charlynn Court, CSW [phone]

## 2017-05-30 NOTE — Progress Notes (Signed)
Notified by Leta Jungling NT to look at patient's arm as primary nurse was with another patient.  IV site on L arm was swollen, red, and patient making movements and appeared to be in pain.  IV access removed and attempted to place a gauze dressing and warm cloth on the site, but patient refused and pulled them off.  Due to patient's mental status unable to reason with her.  Primary nurse made aware of the infiltration.

## 2017-05-30 NOTE — Progress Notes (Signed)
PROGRESS NOTE                                                                                                                                                                                                             Patient Demographics:    Amanda Stanton, is a 84 y.o. female, DOB - July 29, 1931, ZOX:[id number]  Admit date - 05/29/2017   Admitting Physician Delano Metz, MD  Outpatient Primary MD for the patient is Jethro Bastos, MD  LOS - 0   Chief Complaint  Patient presents with  . Shortness of Breath       Brief Narrative   84 y.o. female with medical history significant for dementia, congestive heart failure, hypertension, dementia, withbaseline confusion, brought by EMS with increasing shortness of breath.  X-ray is significant for volume overload, had fever 102.9.  Significant for acute bronchitis and UTI     Subjective:    Amanda Stanton today nonverbal, noncommunicative, cannot provide any complaints.     Assessment  & Plan :    Principal Problem:   Acute respiratory distress Active Problems:   Hypertension   Dementia   DM (diabetes mellitus) (HCC)   CHF (congestive heart failure) (HCC)   Fever  Acute respiratory distress with hypoxia -This is most likely due to volume overload as evident on the chest x-ray, and cute bronchitis given her wheezing and cough, is to be improving, continue with nebs, repeat chest x-ray by tomorrow after appropriate diuresis. -We will add azithromycin to cover for acute bronchitis  UTI -Continue with Rocephin  Hypertension -Blood pressure is soft, continue to hold home medication  Acute on chronic diastolic CHF -Volume overload on imaging on presentation, will 1 dose of IV Lasix given soft blood pressure, Pete x-ray in a.m..  Anemia of chronic disease -Need to monitor  Diabetes mellitus -Continue with insulin sliding scale  Dementia -continue with supportive care       Code  Status : Full  Family Communication  : Cussed with daughter via phone  Disposition Plan  : HOME IN 24 HOURS  Consults  : None  DVT Prophylaxis  : Lovenox  Lab Results  Component Value Date   PLT 138 (L) 05/30/2017    Antibiotics  :    Anti-infectives (From admission, onward)   Start     Dose/Rate Route  Frequency Ordered Stop   05/30/17 1345  cefTRIAXone (ROCEPHIN) 1 g in sodium chloride 0.9 % 100 mL IVPB     1 g 200 mL/hr over 30 Minutes Intravenous Every 24 hours 05/30/17 1337     05/30/17 1345  azithromycin (ZITHROMAX) 500 mg in sodium chloride 0.9 % 250 mL IVPB     500 mg 250 mL/hr over 60 Minutes Intravenous Every 24 hours 05/30/17 1337     05/30/17 1000  doxycycline (VIBRA-TABS) tablet 100 mg     100 mg Oral Every 12 hours 05/29/17 1109     05/30/17 0800  vancomycin (VANCOCIN) IVPB 1000 mg/200 mL premix  Status:  Discontinued     1,000 mg 200 mL/hr over 60 Minutes Intravenous Every 24 hours 05/29/17 0518 05/29/17 1109   05/29/17 0600  vancomycin (VANCOCIN) 1,500 mg in sodium chloride 0.9 % 500 mL IVPB     1,500 mg 250 mL/hr over 120 Minutes Intravenous  Once 05/29/17 0510 05/29/17 0950   05/29/17 0515  ceFEPIme (MAXIPIME) 2 g in sodium chloride 0.9 % 100 mL IVPB  Status:  Discontinued     2 g 200 mL/hr over 30 Minutes Intravenous Every 24 hours 05/29/17 0510 05/29/17 1109        Objective:   Vitals:   05/29/17 1600 05/29/17 1707 05/29/17 2138 05/30/17 0733  BP: 105/61 116/70 122/62 128/66  Pulse: 75 75 85 79  Resp:  16  17  Temp:  98.7 F (37.1 C) 99.1 F (37.3 C) 98.6 F (37 C)  TempSrc:  Axillary Oral Oral  SpO2: 100% (!) 89% 92% 94%  Weight:      Height:        Wt Readings from Last 3 Encounters:  05/29/17 68 kg (150 lb)     Intake/Output Summary (Last 24 hours) at 05/30/2017 1337 Last data filed at 05/30/2017 1000 Gross per 24 hour  Intake -  Output 5 ml  Net -5 ml     Physical Exam  Elderly female laying in bed, demented, confused,  noncommunicative  Symmetrical Chest wall movement, Good air movement bilaterally, CTAB, no use of accessory muscles RRR,No Gallops,Rubs or new Murmurs, No Parasternal Heave +ve B.Sounds, Abd Soft, No tenderness,, No rebound - guarding or rigidity. No Cyanosis, Clubbing or edema, No new Rash or bruise .    Data Review:    CBC Recent Labs  Lab 05/29/17 0327 05/30/17 0025  WBC 6.0 7.3  HGB 10.1* 9.7*  HCT 32.2* 30.7*  PLT 152 138*  MCV 81.3 82.3  MCH 25.5* 26.0  MCHC 31.4 31.6  RDW 14.9 15.1  LYMPHSABS 1.1  --   MONOABS 1.3*  --   EOSABS 0.1  --   BASOSABS 0.0  --     Chemistries  Recent Labs  Lab 05/29/17 0327 05/30/17 0025  NA 141 140  K 3.8 4.2  CL 106 107  CO2 28 25  GLUCOSE 121* 124*  BUN 13 14  CREATININE 1.01* 1.06*  CALCIUM 8.2* 8.4*  AST 18  --   ALT 7*  --   ALKPHOS 60  --   BILITOT 0.9  --    ------------------------------------------------------------------------------------------------------------------ No results for input(s): CHOL, HDL, LDLCALC, TRIG, CHOLHDL, LDLDIRECT in the last 72 hours.  Lab Results  Component Value Date   HGBA1C (H) 11/10/2008    7.9 (NOTE) The ADA recommends the following therapeutic goal for glycemic control related to Hgb A1c measurement: Goal of therapy: <6.5 Hgb A1c  Reference: American Diabetes  Association: Clinical Practice Recommendations 2010, Diabetes Care, 2010, 33: (Suppl  1).   ------------------------------------------------------------------------------------------------------------------ No results for input(s): TSH, T4TOTAL, T3FREE, THYROIDAB in the last 72 hours.  Invalid input(s): FREET3 ------------------------------------------------------------------------------------------------------------------ No results for input(s): VITAMINB12, FOLATE, FERRITIN, TIBC, IRON, RETICCTPCT in the last 72 hours.  Coagulation profile Recent Labs  Lab 05/29/17 0327  INR 1.19    No results for input(s): DDIMER  in the last 72 hours.  Cardiac Enzymes Recent Labs  Lab 05/29/17 0531  TROPONINI <0.03   ------------------------------------------------------------------------------------------------------------------    Component Value Date/Time   BNP 65.8 05/29/2017 0531    Inpatient Medications  Scheduled Meds: . benazepril  20 mg Oral Daily  . citalopram  20 mg Oral Daily  . doxycycline  100 mg Oral Q12H  . enoxaparin (LOVENOX) injection  40 mg Subcutaneous Daily  . furosemide  20 mg Oral Daily  . insulin aspart  0-9 Units Subcutaneous TID WC  . LORazepam  1 mg Oral QHS  . sodium chloride  1 spray Each Nare BID   Continuous Infusions: . azithromycin    . cefTRIAXone (ROCEPHIN)  IV     PRN Meds:.acetaminophen **OR** acetaminophen, albuterol, bisacodyl, guaiFENesin, ipratropium-albuterol, ondansetron **OR** ondansetron (ZOFRAN) IV, senna-docusate  Micro Results Recent Results (from the past 240 hour(s))  Culture, blood (Routine x 2)     Status: None (Preliminary result)   Collection Time: 05/29/17  3:20 AM  Result Value Ref Range Status   Specimen Description BLOOD RIGHT ARM  Final   Special Requests   Final    BOTTLES DRAWN AEROBIC AND ANAEROBIC Blood Culture results may not be optimal due to an excessive volume of blood received in culture bottles   Culture   Final    NO GROWTH 1 DAY Performed at [facility], [address] [place], [place] [address]    Report Status PENDING  Incomplete  Culture, blood (Routine x 2)     Status: None (Preliminary result)   Collection Time: 05/29/17  5:53 AM  Result Value Ref Range Status   Specimen Description BLOOD RIGHT HAND  Final   Special Requests   Final    BOTTLES DRAWN AEROBIC AND ANAEROBIC Blood Culture adequate volume   Culture   Final    NO GROWTH 1 DAY Performed at [facility], [address] [place], [place] [address]    Report Status PENDING  Incomplete  Urine culture     Status: Abnormal (Preliminary result)    Collection Time: 05/29/17 10:51 AM  Result Value Ref Range Status   Specimen Description URINE, RANDOM  Final   Special Requests   Final    NONE Performed at [facility], [address], [place], [place] [address]    Culture 10,000 COLONIES/mL GRAM NEGATIVE RODS (A)  Final   Report Status PENDING  Incomplete    Radiology Reports Dg Chest 2 View  Result Date: 05/29/2017 CLINICAL DATA:  Shortness of breath EXAM: CHEST - 2 VIEW COMPARISON:  05/28/2017 11/11/2008 FINDINGS: No pleural effusion. Mild cardiomegaly with vascular congestion. Mild diffuse interstitial opacities suspicious for minimal edema. No pneumothorax. IMPRESSION: Cardiomegaly with vascular congestion and interstitial opacity suspicious for minimal edema. Electronically Signed   By: Jasmine Pang M.D.   On: 05/29/2017 04:09   Dg Chest 2 View  Result Date: 05/28/2017 CLINICAL DATA:  Cough and shortness of breath EXAM: CHEST - 2 VIEW COMPARISON:  11/11/2008 FINDINGS: Cardiac shadow is stable. Lungs are well aerated bilaterally. Mild  vascular congestion is noted without focal confluent infiltrate. Minimal left pleural effusion is noted. IMPRESSION: Changes consistent with mild CHF. Electronically Signed   By: Alcide Clever M.D.   On: 05/28/2017 12:04    Huey Bienenstock M.D on 05/30/2017 at 1:37 PM  Between 7am to 7pm - Pager - [phone]  After 7pm go to [email or web address] - password [facility]  [facility] -  Office  [phone]

## 2017-05-31 ENCOUNTER — Observation Stay (HOSPITAL_COMMUNITY): Payer: Medicare (Managed Care)

## 2017-05-31 DIAGNOSIS — I5033 Acute on chronic diastolic (congestive) heart failure: Secondary | ICD-10-CM

## 2017-05-31 DIAGNOSIS — R0603 Acute respiratory distress: Secondary | ICD-10-CM | POA: Diagnosis not present

## 2017-05-31 LAB — URINE CULTURE: Culture: 10000 — AB

## 2017-05-31 LAB — STREP PNEUMONIAE URINARY ANTIGEN: STREP PNEUMO URINARY ANTIGEN: NEGATIVE

## 2017-05-31 LAB — GLUCOSE, CAPILLARY
GLUCOSE-CAPILLARY: 112 mg/dL — AB (ref 65–99)
GLUCOSE-CAPILLARY: 109 mg/dL — AB (ref 65–99)

## 2017-05-31 MED ORDER — CEFPODOXIME PROXETIL 200 MG PO TABS: 200.0000 mg | ORAL_TABLET | Freq: Two times a day (BID) | ORAL | 0 refills | Status: DC

## 2017-05-31 MED ORDER — FUROSEMIDE 10 MG/ML IJ SOLN
20.0000 mg | Freq: Once | INTRAMUSCULAR | Status: DC
  Filled 2017-05-31: qty 2

## 2017-05-31 MED ORDER — AZITHROMYCIN 250 MG PO TABS: 250.0000 mg | ORAL_TABLET | Freq: Every day | ORAL | 0 refills | Status: DC

## 2017-05-31 NOTE — Discharge Summary (Signed)
Amanda Stanton, is a 84 y.o. female  DOB 1931/12/07  MRN [id number].  Admission date:  05/29/2017  Admitting Physician  Delano Metz, MD  Discharge Date:  05/31/2017   Primary MD  Jethro Bastos, MD  Recommendations for primary care physician for things to follow:  -Check CBC, BMP during next visit, resume benazepril if blood pressure started to increase   Admission Diagnosis  SOB (shortness of breath) [R06.02] Fever, unspecified fever cause [R50.9]   Discharge Diagnosis  SOB (shortness of breath) [R06.02] Fever, unspecified fever cause [R50.9]    Principal Problem:   Acute respiratory distress Active Problems:   Hypertension   Dementia   DM (diabetes mellitus) (HCC)   CHF (congestive heart failure) (HCC)   Fever      Past Medical History:  Diagnosis Date  . CHF (congestive heart failure) (HCC)   . Dementia   . Diabetes mellitus without complication (HCC)   . Hypertension     History reviewed. No pertinent surgical history.     History of present illness and  Hospital Course:     Kindly see H&P for history of present illness and admission details, please review complete Labs, Consult reports and Test reports for all details in brief  HPI  from the history and physical done on the day of admission HPI: Amanda Stanton is a 84 y.o. female with medical history significant for dementia, congestive heart failure, hypertension, dementia, withbaseline confusion, brought by EMS with increasing shortness of breath.  She is level 5 caveat due to increased confusion, unable to follow commands or verbalize symptoms.  History is obtained by daughter, who reports that, over the last several days, the patient was having increased shortness of breath, and nonproductive cough.  At home, she was found to have a fever of 102.9, with similar values at the ED.  Daughter denies the patient having any sick  contacts.  She did not appear to have any chills or night sweats.  She did have mild decrease oral intake this week.  Denies any nausea or vomiting.  No diarrhea was noted.  Daughter denies the patient having any decreased urine output, or lower extremity swelling.   ED Course:  BP 102/60   Pulse 89   Temp (!) 100.7 F (38.2 C) (Axillary)   Resp 20   Ht  (1.6 m)   Wt 68 kg (150 lb)   SpO2 93%   BMI 26.57 kg/m   Chest x-ray shows cardiomegaly with vascular congestion and interstitial opacity suspicious for minimal edema. Sodium 141, potassium 3.8, bicarb 28, glucose 121, BUN 13, creatinine 1.01, calcium 8.2, LFTs normal. BNP 65.8, lactic acid 0.66 White count 6, hemoglobin 10.1, platelets 152 PT 15, INR 1.19 Cultures pending Patient was placed on Vanco and Maxipime per pharmacy     Hospital Course  84 y.o.femalewith medical history significant fordementia,congestive heart failure, hypertension, dementia, withbaseline confusion, brought by EMS with increasing shortness of breath.  X-ray is significant for volume overload, had fever 102.9.  Significant  for acute bronchitis and UTI  Acute respiratory distress with hypoxia -This is most likely due to volume overload as evident on the chest x-ray, and acute bronchitis given her wheezing and cough, is to be improving, continue with nebs, repeat chest x-ray day of discharge showing some vascular congestion, she is to continue her home days Lasix, she will receive IV Lasix 20 mg before discharge -Another 3 days of azithromycin for bronchitis  UTI -With Rocephin during hospital stay, urine culture showing pansensitive E. coli, to finish another 3 days of Vantin  Hypertension -Blood pressure is soft, continue to hold home medication benazepril can be resumed if blood pressure started to increase  Acute on chronic diastolic CHF -Volume overload on imaging on presentation, back on room air, received IV Lasix yesterday, still  having some vascular congestion, I will give another dose of IV Lasix before discharge today, she is to continue her home dose Lasix on discharge .  Diabetes mellitus -CBGs are controlled during hospital stay, not on any medications  Dementia -continue with supportive care       Discharge Condition:  Stable   Follow UP  Follow-up Information    Jethro Bastos, MD Follow up in 1 week(s).   Specialty:  Family Medicine Contact information: [address] [place] [address] [phone]             Discharge Instructions  and  Discharge Medications     Discharge Instructions    Discharge instructions   Complete by:  As directed    Follow with Primary MD Jethro Bastos, MD in 7 days   Get CBC, CMP, 2 view Chest X ray checked  by Primary MD next visit.    Activity: As tolerated with Full fall precautions use walker/cane & assistance as needed   Disposition Home    Diet: Carbohydrate modified, with feeding assistance and aspiration precautions.  For Heart failure patients - Check your Weight same time everyday, if you gain over 2 pounds, or you develop in leg swelling, experience more shortness of breath or chest pain, call your Primary MD immediately. Follow Cardiac Low Salt Diet and 1.5 lit/day fluid restriction.   On your next visit with your primary care physician please Get Medicines reviewed and adjusted.   Please request your Prim.MD to go over all Hospital Tests and Procedure/Radiological results at the follow up, please get all Hospital records sent to your Prim MD by signing hospital release before you go home.   If you experience worsening of your admission symptoms, develop shortness of breath, life threatening emergency, suicidal or homicidal thoughts you must seek medical attention immediately by calling 911 or calling your MD immediately  if symptoms less severe.  You Must read complete instructions/literature along with all the  possible adverse reactions/side effects for all the Medicines you take and that have been prescribed to you. Take any new Medicines after you have completely understood and accpet all the possible adverse reactions/side effects.   Do not drive, operating heavy machinery, perform activities at heights, swimming or participation in water activities or provide baby sitting services if your were admitted for syncope or siezures until you have seen by Primary MD or a Neurologist and advised to do so again.  Do not drive when taking Pain medications.    Do not take more than prescribed Pain, Sleep and Anxiety Medications  Special Instructions: If you have smoked or chewed Tobacco  in the last 2 yrs please stop  smoking, stop any regular Alcohol  and or any Recreational drug use.  Wear Seat belts while driving.   Please note  You were cared for by a hospitalist during your hospital stay. If you have any questions about your discharge medications or the care you received while you were in the hospital after you are discharged, you can call the unit and asked to speak with the hospitalist on call if the hospitalist that took care of you is not available. Once you are discharged, your primary care physician will handle any further medical issues. Please note that NO REFILLS for any discharge medications will be authorized once you are discharged, as it is imperative that you return to your primary care physician (or establish a relationship with a primary care physician if you do not have one) for your aftercare needs so that they can reassess your need for medications and monitor your lab values.   Increase activity slowly   Complete by:  As directed      Allergies as of 05/31/2017      Reactions   Aricept [donepezil Hcl]       Medication List    STOP taking these medications   benazepril 20 MG tablet Commonly known as:  LOTENSIN     TAKE these medications   azithromycin 250 MG  tablet Commonly known as:  ZITHROMAX Z-PAK Take 1 tablet (250 mg total) by mouth daily.   cefpodoxime 200 MG tablet Commonly known as:  VANTIN Take 1 tablet (200 mg total) by mouth 2 (two) times daily.   citalopram 20 MG tablet Commonly known as:  CELEXA Take 20 mg by mouth daily.   eucerin cream Apply 1 application topically 2 (two) times daily.   furosemide 20 MG tablet Commonly known as:  LASIX Take 20 mg by mouth daily.   HYDROcodone-acetaminophen 5-325 MG tablet Commonly known as:  NORCO/VICODIN Take 1 tablet by mouth every 6 (six) hours as needed for moderate pain.   LORazepam 0.5 MG tablet Commonly known as:  ATIVAN Take 1 mg by mouth at bedtime.   sodium chloride 0.65 % Soln nasal spray Commonly known as:  OCEAN Place 1 spray into both nostrils 2 (two) times daily.   Vitamin D (Ergocalciferol) 50000 units Caps capsule Commonly known as:  DRISDOL Take 50,000 Units by mouth every 30 (thirty) days.         Diet and Activity recommendation: See Discharge Instructions above   Consults obtained -  None   Major procedures and Radiology Reports - PLEASE review detailed and final reports for all details, in brief -     Dg Chest 2 View  Result Date: 05/29/2017 CLINICAL DATA:  Shortness of breath EXAM: CHEST - 2 VIEW COMPARISON:  05/28/2017 11/11/2008 FINDINGS: No pleural effusion. Mild cardiomegaly with vascular congestion. Mild diffuse interstitial opacities suspicious for minimal edema. No pneumothorax. IMPRESSION: Cardiomegaly with vascular congestion and interstitial opacity suspicious for minimal edema. Electronically Signed   By: Jasmine Pang M.D.   On: 05/29/2017 04:09   Dg Chest 2 View  Result Date: 05/28/2017 CLINICAL DATA:  Cough and shortness of breath EXAM: CHEST - 2 VIEW COMPARISON:  11/11/2008 FINDINGS: Cardiac shadow is stable. Lungs are well aerated bilaterally. Mild vascular congestion is noted without focal confluent infiltrate. Minimal left  pleural effusion is noted. IMPRESSION: Changes consistent with mild CHF. Electronically Signed   By: Alcide Clever M.D.   On: 05/28/2017 12:04   Dg Chest [facility] 1 View  Result  Date: 05/31/2017 CLINICAL DATA:  Dyspnea. EXAM: PORTABLE CHEST 1 VIEW COMPARISON:  05/29/2017 FINDINGS: Normal heart size. No pleural effusions. Mild pulmonary vascular congestion is identified, increased from comparison exam. No airspace opacities identified. IMPRESSION: 1. Increase in pulmonary vascular congestion. Electronically Signed   By: Signa Kell M.D.   On: 05/31/2017 08:47    Micro Results     Recent Results (from the past 240 hour(s))  Culture, blood (Routine x 2)     Status: None (Preliminary result)   Collection Time: 05/29/17  3:20 AM  Result Value Ref Range Status   Specimen Description BLOOD RIGHT ARM  Final   Special Requests   Final    BOTTLES DRAWN AEROBIC AND ANAEROBIC Blood Culture results may not be optimal due to an excessive volume of blood received in culture bottles   Culture   Final    NO GROWTH 2 DAYS Performed at [facility], [address] [place], [place] [address]    Report Status PENDING  Incomplete  Culture, blood (Routine x 2)     Status: None (Preliminary result)   Collection Time: 05/29/17  5:53 AM  Result Value Ref Range Status   Specimen Description BLOOD RIGHT HAND  Final   Special Requests   Final    BOTTLES DRAWN AEROBIC AND ANAEROBIC Blood Culture adequate volume   Culture   Final    NO GROWTH 2 DAYS Performed at [facility], [address], [place], [place] [address]    Report Status PENDING  Incomplete  Urine culture     Status: Abnormal   Collection Time: 05/29/17 10:51 AM  Result Value Ref Range Status   Specimen Description URINE, RANDOM  Final   Special Requests   Final    NONE Performed at [facility], [address], [place], [place] [address]    Culture 10,000 COLONIES/mL ESCHERICHIA COLI (A)  Final   Report Status 05/31/2017  FINAL  Final   Organism ID, Bacteria ESCHERICHIA COLI (A)  Final      Susceptibility   Escherichia coli - MIC*    AMPICILLIN <=2 SENSITIVE Sensitive     CEFAZOLIN <=4 SENSITIVE Sensitive     CEFTRIAXONE <=1 SENSITIVE Sensitive     CIPROFLOXACIN <=0.25 SENSITIVE Sensitive     GENTAMICIN <=1 SENSITIVE Sensitive     IMIPENEM <=0.25 SENSITIVE Sensitive     NITROFURANTOIN <=16 SENSITIVE Sensitive     TRIMETH/SULFA <=20 SENSITIVE Sensitive     AMPICILLIN/SULBACTAM <=2 SENSITIVE Sensitive     PIP/TAZO <=4 SENSITIVE Sensitive     Extended ESBL NEGATIVE Sensitive     * 10,000 COLONIES/mL ESCHERICHIA COLI       Today   Subjective:   [name] today is nonverbal , no significant events overnight  Objective:   Blood pressure (!) 108/95, pulse 75, temperature 98.5 F (36.9 C), temperature source Oral, resp. rate (!) 24, height  (1.6 m), weight 68 kg (150 lb), SpO2 95 %.   Intake/Output Summary (Last 24 hours) at 05/31/2017 1128 Last data filed at 05/30/2017 2000 Gross per 24 hour  Intake 350 ml  Output -  Net 350 ml    Exam Elderly female laying in bed, demented, confused, noncommunicative  Symmetrical Chest wall movement, Good air movement bilaterally, CTAB, no use of accessory muscles RRR,No Gallops,Rubs or new Murmurs, No Parasternal Heave +ve B.Sounds, Abd Soft, No tenderness,, No rebound - guarding or rigidity. No Cyanosis, Clubbing or edema, No new  Rash or bruise     Data Review   CBC w Diff:  Lab Results  Component Value Date   WBC 7.3 05/30/2017   HGB 9.7 (L) 05/30/2017   HCT 30.7 (L) 05/30/2017   PLT 138 (L) 05/30/2017   LYMPHOPCT 18 05/29/2017   MONOPCT 21 05/29/2017   EOSPCT 1 05/29/2017   BASOPCT 0 05/29/2017    CMP:  Lab Results  Component Value Date   NA 140 05/30/2017   K 4.2 05/30/2017   CL 107 05/30/2017   CO2 25 05/30/2017   BUN 14 05/30/2017   CREATININE 1.06 (H) 05/30/2017   PROT 7.0 05/29/2017   ALBUMIN 3.0 (L) 05/29/2017    BILITOT 0.9 05/29/2017   ALKPHOS 60 05/29/2017   AST 18 05/29/2017   ALT 7 (L) 05/29/2017  .   Total Time in preparing paper work, data evaluation and todays exam - 35 minutes  Huey Bienenstock M.D on 05/31/2017 at 11:28 AM  [facility]   Office  [phone]

## 2017-05-31 NOTE — Discharge Instructions (Signed)
Follow with Primary MD Jethro Bastos, MD in 7 days   Get CBC, CMP, 2 view Chest X ray checked  by Primary MD next visit.    Activity: As tolerated with Full fall precautions use walker/cane & assistance as needed   Disposition Home    Diet: Carbohydrate modified, with feeding assistance and aspiration precautions.  For Heart failure patients - Check your Weight same time everyday, if you gain over 2 pounds, or you develop in leg swelling, experience more shortness of breath or chest pain, call your Primary MD immediately. Follow Cardiac Low Salt Diet and 1.5 lit/day fluid restriction.   On your next visit with your primary care physician please Get Medicines reviewed and adjusted.   Please request your Prim.MD to go over all Hospital Tests and Procedure/Radiological results at the follow up, please get all Hospital records sent to your Prim MD by signing hospital release before you go home.   If you experience worsening of your admission symptoms, develop shortness of breath, life threatening emergency, suicidal or homicidal thoughts you must seek medical attention immediately by calling 911 or calling your MD immediately  if symptoms less severe.  You Must read complete instructions/literature along with all the possible adverse reactions/side effects for all the Medicines you take and that have been prescribed to you. Take any new Medicines after you have completely understood and accpet all the possible adverse reactions/side effects.   Do not drive, operating heavy machinery, perform activities at heights, swimming or participation in water activities or provide baby sitting services if your were admitted for syncope or siezures until you have seen by Primary MD or a Neurologist and advised to do so again.  Do not drive when taking Pain medications.    Do not take more than prescribed Pain, Sleep and Anxiety Medications  Special Instructions: If you have smoked or chewed  Tobacco  in the last 2 yrs please stop smoking, stop any regular Alcohol  and or any Recreational drug use.  Wear Seat belts while driving.   Please note  You were cared for by a hospitalist during your hospital stay. If you have any questions about your discharge medications or the care you received while you were in the hospital after you are discharged, you can call the unit and asked to speak with the hospitalist on call if the hospitalist that took care of you is not available. Once you are discharged, your primary care physician will handle any further medical issues. Please note that NO REFILLS for any discharge medications will be authorized once you are discharged, as it is imperative that you return to your primary care physician (or establish a relationship with a primary care physician if you do not have one) for your aftercare needs so that they can reassess your need for medications and monitor your lab values.

## 2017-06-03 LAB — CULTURE, BLOOD (ROUTINE X 2)
Culture: NO GROWTH
Culture: NO GROWTH
SPECIAL REQUESTS: ADEQUATE

## 2019-05-04 ENCOUNTER — Ambulatory Visit
Admission: RE | Admit: 2019-05-04 | Discharge: 2019-05-04 | Disposition: A | Discharge status: Home / Self Care | Payer: Medicare (Managed Care) | Source: Ambulatory Visit | Attending: Gerontology | Admitting: Gerontology

## 2019-05-04 ENCOUNTER — Other Ambulatory Visit: Payer: Self-pay | Admitting: Gerontology

## 2019-05-04 DIAGNOSIS — W19XXXA Unspecified fall, initial encounter: Secondary | ICD-10-CM

## 2019-05-06 ENCOUNTER — Other Ambulatory Visit: Payer: Self-pay | Admitting: Gerontology

## 2019-05-06 ENCOUNTER — Ambulatory Visit
Admission: RE | Admit: 2019-05-06 | Discharge: 2019-05-06 | Disposition: A | Discharge status: Home / Self Care | Payer: Medicare (Managed Care) | Source: Ambulatory Visit | Attending: Gerontology | Admitting: Gerontology

## 2019-05-06 DIAGNOSIS — R269 Unspecified abnormalities of gait and mobility: Secondary | ICD-10-CM

## 2020-06-25 ENCOUNTER — Inpatient Hospital Stay (HOSPITAL_COMMUNITY)
Admission: EM | Admit: 2020-06-25 | Discharge: 2020-06-28 | DRG: 291 | Disposition: A | Discharge status: Home / Self Care | Payer: Medicare (Managed Care) | Attending: Family Medicine | Admitting: Family Medicine

## 2020-06-25 ENCOUNTER — Emergency Department (HOSPITAL_COMMUNITY): Payer: Medicare (Managed Care)

## 2020-06-25 ENCOUNTER — Other Ambulatory Visit: Payer: Self-pay

## 2020-06-25 ENCOUNTER — Encounter (HOSPITAL_COMMUNITY): Payer: Self-pay

## 2020-06-25 DIAGNOSIS — I272 Pulmonary hypertension, unspecified: Secondary | ICD-10-CM | POA: Diagnosis present

## 2020-06-25 DIAGNOSIS — F0390 Unspecified dementia without behavioral disturbance: Secondary | ICD-10-CM | POA: Diagnosis present

## 2020-06-25 DIAGNOSIS — I110 Hypertensive heart disease with heart failure: Secondary | ICD-10-CM | POA: Diagnosis not present

## 2020-06-25 DIAGNOSIS — N390 Urinary tract infection, site not specified: Secondary | ICD-10-CM | POA: Diagnosis present

## 2020-06-25 DIAGNOSIS — I509 Heart failure, unspecified: Secondary | ICD-10-CM

## 2020-06-25 DIAGNOSIS — Z6820 Body mass index (BMI) 20.0-20.9, adult: Secondary | ICD-10-CM

## 2020-06-25 DIAGNOSIS — E43 Unspecified severe protein-calorie malnutrition: Secondary | ICD-10-CM | POA: Diagnosis present

## 2020-06-25 DIAGNOSIS — E119 Type 2 diabetes mellitus without complications: Secondary | ICD-10-CM | POA: Diagnosis present

## 2020-06-25 DIAGNOSIS — Z20822 Contact with and (suspected) exposure to covid-19: Secondary | ICD-10-CM | POA: Diagnosis present

## 2020-06-25 DIAGNOSIS — I480 Paroxysmal atrial fibrillation: Secondary | ICD-10-CM | POA: Diagnosis present

## 2020-06-25 DIAGNOSIS — R0600 Dyspnea, unspecified: Secondary | ICD-10-CM | POA: Diagnosis not present

## 2020-06-25 DIAGNOSIS — R9431 Abnormal electrocardiogram [ECG] [EKG]: Secondary | ICD-10-CM | POA: Diagnosis present

## 2020-06-25 DIAGNOSIS — R911 Solitary pulmonary nodule: Secondary | ICD-10-CM | POA: Diagnosis present

## 2020-06-25 DIAGNOSIS — D6489 Other specified anemias: Secondary | ICD-10-CM | POA: Diagnosis present

## 2020-06-25 DIAGNOSIS — J9601 Acute respiratory failure with hypoxia: Secondary | ICD-10-CM | POA: Diagnosis present

## 2020-06-25 DIAGNOSIS — I5023 Acute on chronic systolic (congestive) heart failure: Secondary | ICD-10-CM

## 2020-06-25 DIAGNOSIS — R4701 Aphasia: Secondary | ICD-10-CM | POA: Diagnosis present

## 2020-06-25 DIAGNOSIS — I5033 Acute on chronic diastolic (congestive) heart failure: Secondary | ICD-10-CM | POA: Diagnosis present

## 2020-06-25 DIAGNOSIS — R509 Fever, unspecified: Secondary | ICD-10-CM

## 2020-06-25 DIAGNOSIS — E538 Deficiency of other specified B group vitamins: Secondary | ICD-10-CM | POA: Diagnosis present

## 2020-06-25 DIAGNOSIS — I5043 Acute on chronic combined systolic (congestive) and diastolic (congestive) heart failure: Secondary | ICD-10-CM | POA: Diagnosis present

## 2020-06-25 DIAGNOSIS — Z66 Do not resuscitate: Secondary | ICD-10-CM | POA: Diagnosis present

## 2020-06-25 DIAGNOSIS — Z79899 Other long term (current) drug therapy: Secondary | ICD-10-CM

## 2020-06-25 LAB — CBC WITH DIFFERENTIAL/PLATELET
Abs Immature Granulocytes: 0.02 10*3/uL (ref 0.00–0.07)
Basophils Absolute: 0 10*3/uL (ref 0.0–0.1)
Basophils Relative: 1 %
Eosinophils Absolute: 0.1 10*3/uL (ref 0.0–0.5)
Eosinophils Relative: 2 %
HCT: 35.3 % — ABNORMAL LOW (ref 36.0–46.0)
Hemoglobin: 10.6 g/dL — ABNORMAL LOW (ref 12.0–15.0)
Immature Granulocytes: 0 %
Lymphocytes Relative: 17 %
Lymphs Abs: 1 10*3/uL (ref 0.7–4.0)
MCH: 26.2 pg (ref 26.0–34.0)
MCHC: 30 g/dL (ref 30.0–36.0)
MCV: 87.4 fL (ref 80.0–100.0)
Monocytes Absolute: 0.5 10*3/uL (ref 0.1–1.0)
Monocytes Relative: 9 %
Neutro Abs: 3.9 10*3/uL (ref 1.7–7.7)
Neutrophils Relative %: 71 %
Platelets: 205 10*3/uL (ref 150–400)
RBC: 4.04 MIL/uL (ref 3.87–5.11)
RDW: 16.5 % — ABNORMAL HIGH (ref 11.5–15.5)
WBC: 5.5 10*3/uL (ref 4.0–10.5)
nRBC: 0 % (ref 0.0–0.2)

## 2020-06-25 LAB — BASIC METABOLIC PANEL
Anion gap: 6 (ref 5–15)
BUN: 7 mg/dL — ABNORMAL LOW (ref 8–23)
CO2: 26 mmol/L (ref 22–32)
Calcium: 8.2 mg/dL — ABNORMAL LOW (ref 8.9–10.3)
Chloride: 109 mmol/L (ref 98–111)
Creatinine, Ser: 0.58 mg/dL (ref 0.44–1.00)
GFR, Estimated: >60 mL/min (ref >60)
Glucose, Bld: 94 mg/dL (ref 70–99)
Potassium: 3.6 mmol/L (ref 3.5–5.1)
Sodium: 141 mmol/L (ref 135–145)

## 2020-06-25 LAB — I-STAT CHEM 8, ED
BUN: 8 mg/dL (ref 8–23)
Calcium, Ion: 1.06 mmol/L — ABNORMAL LOW (ref 1.15–1.40)
Chloride: 107 mmol/L (ref 98–111)
Creatinine, Ser: 0.6 mg/dL (ref 0.44–1.00)
Glucose, Bld: 93 mg/dL (ref 70–99)
HCT: 34 % — ABNORMAL LOW (ref 36.0–46.0)
Hemoglobin: 11.6 g/dL — ABNORMAL LOW (ref 12.0–15.0)
Potassium: 3.6 mmol/L (ref 3.5–5.1)
Sodium: 145 mmol/L (ref 135–145)
TCO2: 26 mmol/L (ref 22–32)

## 2020-06-25 LAB — RESP PANEL BY RT-PCR (FLU A&B, COVID) ARPGX2
Influenza A by PCR: NEGATIVE
Influenza B by PCR: NEGATIVE
SARS Coronavirus 2 by RT PCR: NEGATIVE

## 2020-06-25 LAB — URINALYSIS, ROUTINE W REFLEX MICROSCOPIC
Bilirubin Urine: NEGATIVE
Glucose, UA: NEGATIVE mg/dL
Hgb urine dipstick: NEGATIVE
Ketones, ur: NEGATIVE mg/dL
Leukocytes,Ua: NEGATIVE
Nitrite: NEGATIVE
Protein, ur: NEGATIVE mg/dL
Specific Gravity, Urine: 1.015 (ref 1.005–1.030)
pH: 8 (ref 5.0–8.0)

## 2020-06-25 LAB — BRAIN NATRIURETIC PEPTIDE: B Natriuretic Peptide: 185 pg/mL — ABNORMAL HIGH (ref 0.0–100.0)

## 2020-06-25 LAB — LACTIC ACID, PLASMA
Lactic Acid, Venous: 2.4 mmol/L (ref 0.5–1.9)
Lactic Acid, Venous: 2.3 mmol/L (ref 0.5–1.9)

## 2020-06-25 LAB — TROPONIN I (HIGH SENSITIVITY)
Troponin I (High Sensitivity): 24 ng/L — ABNORMAL HIGH (ref <18)
Troponin I (High Sensitivity): 19 ng/L — ABNORMAL HIGH (ref <18)
Troponin I (High Sensitivity): 27 ng/L — ABNORMAL HIGH (ref <18)

## 2020-06-25 MED ORDER — CITALOPRAM HYDROBROMIDE 20 MG PO TABS
20.0000 mg | ORAL_TABLET | Freq: Every day | ORAL | Status: DC
  Administered 2020-06-25 – 2020-06-27 (×3): 20 mg via ORAL
  Filled 2020-06-25 (×3): qty 1

## 2020-06-25 MED ORDER — VITAMIN B-12 1000 MCG PO TABS
1000.0000 ug | ORAL_TABLET | Freq: Every morning | ORAL | Status: DC
  Administered 2020-06-26 – 2020-06-28 (×3): 1000 ug via ORAL
  Filled 2020-06-25 (×3): qty 1

## 2020-06-25 MED ORDER — ACETAMINOPHEN 325 MG PO TABS
650.0000 mg | ORAL_TABLET | Freq: Four times a day (QID) | ORAL | Status: DC | PRN
  Administered 2020-06-26: 650 mg via ORAL
  Filled 2020-06-25: qty 2

## 2020-06-25 MED ORDER — FUROSEMIDE 10 MG/ML IJ SOLN
20.0000 mg | Freq: Once | INTRAMUSCULAR | Status: AC
  Administered 2020-06-25: 20 mg via INTRAVENOUS
  Filled 2020-06-25: qty 2

## 2020-06-25 MED ORDER — LORAZEPAM 0.5 MG PO TABS
0.5000 mg | ORAL_TABLET | Freq: Every day | ORAL | Status: DC
  Administered 2020-06-25 – 2020-06-27 (×3): 0.5 mg via ORAL
  Filled 2020-06-25 (×3): qty 1

## 2020-06-25 MED ORDER — IOHEXOL 350 MG/ML SOLN
50.0000 mL | Freq: Once | INTRAVENOUS | Status: AC | PRN
  Administered 2020-06-25: 50 mL via INTRAVENOUS

## 2020-06-25 MED ORDER — ENOXAPARIN SODIUM 40 MG/0.4ML IJ SOSY
40.0000 mg | PREFILLED_SYRINGE | INTRAMUSCULAR | Status: DC
  Administered 2020-06-25 – 2020-06-27 (×3): 40 mg via SUBCUTANEOUS
  Filled 2020-06-25 (×3): qty 0.4

## 2020-06-25 MED ORDER — ACETAMINOPHEN 650 MG RE SUPP: 650.0000 mg | Freq: Four times a day (QID) | RECTAL | Status: DC | PRN

## 2020-06-25 NOTE — H&P (Addendum)
Family Medicine Teaching [facility] Admission History and Physical Service Pager: [phone]  Patient name: Amanda Stanton Medical record number: [id number] Date of birth: 11/25/1931 Age: 87 y.o. Gender: female  Primary Care Provider: Jethro Bastos, MD Consultants: None Code Status: Full Preferred Emergency Contact: Johny Chess [phone] (daughter and legal guardian)  Chief Complaint: Dyspnea   Assessment and Plan: Amanda Stanton is a 87 y.o. female presenting with labored breathing per daughter. PMH is significant for dementia, HTN.   Increases work of breathing 2/2 CHF exacerbation Patient with history of CHF (per 2019 admission) on daily Lasix presents with labored breathing x2-3 days per daughter. With increasing dyspnea, family attempted to use albuterol inhaler with spacer without improvement. In the ED, patient initially had O2 saturations in the 70s that improved with 3L O2. Labs showed BNP of 185; troponin was minimally elevated and trended flat at 24>19; lactic acid elevated 2.4>2.3. CXR with pulmonary edema with trace b/l pleural effusions. CTA PE showed no PE but consistent with signs of heart failure as seen in CXR as well as reflux of contrast into hepatic veins (can be seen in right heart failure). S/p 1 dose lasix 20mg  with a total of noted during evaluation.  Blood and urine cultures were collected in the ED. At this time, do not feel that there is an infectious source as WBC is 5.5, CXR negative, negative flu and COVID, UA without leukocytes or nitrites, no other symptoms of note (though patient is non-verbal and difficult to assess). Elevated lactic acid (without anion gap acidosis present) possibly related to heart failure. - Admit to FPTS, med-tele, attending Dr. McDiarmid - Daily weights - Strict Is&Os  - Vitals per routine - Continuous cardiac telemetry and pulse ox - Supplemental O2 as needed to maintain oxygen saturations >92% - PT/OT/SLP eval and  treat - Monitor UOP response to lasix - AM labs: A1c, TSH, CBC, BMP - Follow-up lactic acid levels - Repeat EKG - Echocardiogram - Follow-up blood and urine cultures - Diet per speech therapy evaluation  HTN Bp on admission 164/91. No blood pressure medications at home. Possible volume overload state contributing.  - Continue to monitor BP - Consider initiation of oral agent if persistently elevated  Dementia  Patient has dementia and is nonverbal at baseline. Per daughter, does not follow commands and requires assistance with all tasks including eating. She eats a soft food diet at home. Home medications include Celexa 20mg  nightly, and Lorazepam 0.5mg  QHS.  - SLP consult for diet recommendations - Continue home celexa and lorazepam - Fall precautions - Delirium precautions  Anemia  H/o Vitamin B12 deficiency Hemoglobin 10.6 on admission. Baseline difficult to assess as we do not have records, but was around 10 in 2019. Home medication of B12 daily. - Continue home B12 - Continue to monitor with CBC - Consider iron panel studies  History of diabetes? Glucose on BMP has been 94>93. HbA1c in 2010 elevated at 7.9, not currently on medications. Per daughter, her PCP has said that she is pre-diabetic.  - Follow-up HbA1c  Right pulmonary nodule on CT CT scan of chest showed 41mm right middle lobe pulmonary nodule. Recommended non-contrast chest CT and 6-12 months. If nodule is stable at repeat, then CT at 18-24 months (from today's scan).  - Repeat CT chest in 6-12 months outpatient  FEN/GI: Awaiting speech evaluation Prophylaxis: Lovenox  Disposition: Med-telemetry  History of Present Illness:  Amanda Stanton is a 86 y.o. female presenting with shortness of  breath per daughter. Patient non-verbal and unable to provide history.  History provided by daughter: On 6/17 patient began to have labored breathing with some wheezing and using a "lot of her neck muscles when  breathing". The labored breathing stopped on 6/18 but started back up late on 6/19 into this morning. Patient does have an albuterol inhaler with a spacer that the daughter attempted to administer without much improvement. Daughter did not notice any swelling in her legs.  Patient is currently living with her daughter. Daughter wanted patient to be full code so that if anything happened, the rest of the family ([place] in [place] would be able to say their goodbyes if necessary.    Review Of Systems: Per HPI with the following additions:   Review of Systems  Unable to perform ROS: Patient nonverbal    Patient Active Problem List   Diagnosis Date Noted   Hypertension 05/29/2017   Dementia (HCC) 05/29/2017   DM (diabetes mellitus) (HCC) 05/29/2017   CHF (congestive heart failure) (HCC) 05/29/2017   Acute respiratory distress 05/29/2017   Fever 05/29/2017    Past Medical History: Past Medical History:  Diagnosis Date   CHF (congestive heart failure) (HCC)    Dementia (HCC)    Diabetes mellitus without complication (HCC)    Hypertension     Past Surgical History: History reviewed. No pertinent surgical history.  Social History: Social History   Tobacco Use   Smoking status: Never   Smokeless tobacco: Never  Substance Use Topics   Alcohol use: No   Additional social history: Lives with daughter  Please also refer to relevant sections of EMR.  Family History: History reviewed. No pertinent family history.  Allergies and Medications: Allergies  Allergen Reactions   Aricept [Donepezil Hcl] Other (See Comments)    Unknown reaction   No current facility-administered medications on file prior to encounter.   Current Outpatient Medications on File Prior to Encounter  Medication Sig Dispense Refill   acetaminophen (TYLENOL) 160 MG/5ML solution Take 480 mg by mouth every 12 (twelve) hours as needed for headache (pain).     albuterol (VENTOLIN HFA) 108 (90 Base) MCG/ACT  inhaler Inhale 2 puffs into the lungs every 4 (four) hours as needed for wheezing.     Cholecalciferol (VITAMIN D3) 1.25 MG (50000 UT) CAPS Take 50,000 Units by mouth every 30 (thirty) days. On or about the 15th of each month     citalopram (CELEXA) 20 MG tablet Take 20 mg by mouth at bedtime.     furosemide (LASIX) 20 MG tablet Take 20 mg by mouth every morning.     guaifenesin (ROBITUSSIN) 100 MG/5ML syrup Take 200 mg by mouth every 4 (four) hours as needed for cough.     LORazepam (ATIVAN) 0.5 MG tablet Take 0.5 mg by mouth at bedtime. For anxiety/insomnia     vitamin B-12 (CYANOCOBALAMIN) 1000 MCG tablet Take 1,000 mcg by mouth every morning.     Zinc Oxide (BALMEX EX) Apply 1 application topically 2 (two) times daily. Apply to buttocks      Objective: BP (!) 164/91   Pulse 68   Temp 98.2 F (36.8 C) (Rectal)   Resp 19   Ht 5\' 3"  (1.6 m)   Wt 68 kg   SpO2 92%   BMI 26.56 kg/m  Exam: General -- non-verbal, NAD HEENT -- Head is normocephalic.  Neck -- supple; no bruits. Unable to assess JVD as patient would not lay head back/follow commands Integument --  dry and warm, unable to move patient to assess for sacral ulcers Chest -- patient unable to take deep respiration on command, difficult to auscultate lung fields. Breathing comfortably on room air, no increased WOB Cardiac -- RRR. Systolic murmur appreciated Abdomen -- soft, nontender. No masses palpable. Bowel sounds present. Genital, rectal and breast exam -- deferred. CNS -- Unable to assess interactive cranial nerves as patient unable to follow commands. Patient does respond to sound and touch, moves away from noxious stimuli, tracks around the room with her eyes Extremeties - trace peripheral edema noted in BLE.   Labs and Imaging: CBC BMET  Recent Labs  Lab 06/25/20 1137 06/25/20 1146  WBC 5.5  --   HGB 10.6* 11.6*  HCT 35.3* 34.0*  PLT 205  --    Recent Labs  Lab 06/25/20 1137 06/25/20 1146  NA 141 145  K 3.6  3.6  CL 109 107  CO2 26  --   BUN 7* 8  CREATININE 0.58 0.60  GLUCOSE 94 93  CALCIUM 8.2*  --      EKG: Difficult to read, P waves present in some leads, though rate appears irregular. Qtc prolonged  DG Chest 2 View  Result Date: 06/25/2020 CLINICAL DATA:  Shortness of breath.  Wheezing. EXAM: CHEST - 2 VIEW.  Patient is rotated. COMPARISON:  Chest x-ray 05/31/2017 FINDINGS: The heart size and mediastinal contours are unchanged. No definite focal consolidation. Increased interstitial markings. Blunting of bilateral costophrenic angles. Trace bilateral pleural effusions. No pneumothorax. No acute osseous abnormality. IMPRESSION: Pulmonary edema with trace bilateral pleural effusions. Electronically Signed   By: Tish Frederickson M.D.   On: 06/25/2020 01:37   CT Angio Chest PE W and/or Wo Contrast  Result Date: 06/25/2020 CLINICAL DATA:  Suspected pulmonary embolism. EXAM: CT ANGIOGRAPHY CHEST WITH CONTRAST TECHNIQUE: Multidetector CT imaging of the chest was performed using the standard protocol during bolus administration of intravenous contrast. Multiplanar CT image reconstructions and MIPs were obtained to evaluate the vascular anatomy. CONTRAST:  15mL OMNIPAQUE IOHEXOL 350 MG/ML SOLN COMPARISON:  Chest x-ray from June 25, 2020. FINDINGS: Cardiovascular: Calcified and noncalcified atheromatous plaque of the thoracic aorta. Nonaneurysmal caliber. Aorta is not well opacified. No definite signs of acute aortic process. The heart size is moderately enlarged without pericardial effusion. RIGHT heart enlargement. Reflux of contrast into the hepatic veins during injection. Central pulmonary vasculature with density of 476 Hounsfield units, pulmonary vascular bed is well opacified. Some limited assessment at the distal segmental and subsegmental level at the lung bases, no visible pulmonary embolism Mediastinum/Nodes: No adenopathy in the chest. Mild fullness of RIGHT hilar nodal tissue 1 cm. No thoracic  inlet or axillary lymphadenopathy. No gross esophageal abnormality. Lungs/Pleura: Well-circumscribed pulmonary nodule in the RIGHT middle lobe (image 83/6) 6 mm. Septal thickening and small bilateral pleural effusions. Airways are patent. Upper Abdomen: Incidental imaging of upper abdominal contents without acute process. Musculoskeletal: Spinal degenerative changes. No acute bone finding or destructive bone process. Review of the MIP images confirms the above findings. IMPRESSION: 1. No pulmonary embolism. Mildly limited assessment of the lung bases due to respiratory motion as described. 2. Signs of pulmonary edema and pleural effusions. Constellation of findings most suggestive of heart failure. 3. Moderate cardiomegaly with signs of right heart enlargement and reflux of contrast into the hepatic veins during injection. This can be seen in the setting of RIGHT heart dysfunction. 4. 6 mm RIGHT middle lobe pulmonary nodule. Non-contrast chest CT at 6-12 months is recommended.  If the nodule is stable at time of repeat CT, then future CT at 18-24 months (from today's scan) is considered optional for low-risk patients, but is recommended for high-risk patients. This recommendation follows the consensus statement: Guidelines for Management of Incidental Pulmonary Nodules Detected on CT Images: From the Fleischner Society 2017; Radiology 2017; 284:228-243. 5.  Aortic Atherosclerosis (ICD10-I70.0). Aortic atherosclerosis. Electronically Signed   By: Donzetta Kohut M.D.   On: 06/25/2020 13:44     Evelena Leyden, DO 06/25/2020, 2:57 PM PGY-1, [facility] FPTS Intern pager: [phone], text pages welcome  FPTS Upper-Level Resident Addendum   I have independently interviewed and examined the patient. I have discussed the above with the original author and agree with their documentation. My edits for correction/addition/clarification are within the document. Please see also any attending notes.    Lavonda Jumbo, DO PGY-2,  Family Medicine 06/25/2020 5:57 PM  FPTS Service pager: [phone] (text pages welcome through [facility])

## 2020-06-25 NOTE — ED Notes (Signed)
Pts daughter stated she wanted to take her mother home. Staff informed her that her oxygen was low without the oxygen she was being given. Pts daughter stated that she does not want her mother in the lobby and that is why they call EMS. Pt was taken to XRAY and daughter went with pt.

## 2020-06-25 NOTE — ED Notes (Signed)
Patient transported to CT 

## 2020-06-25 NOTE — ED Notes (Addendum)
Pts daughter stated she wanted to take the oxygen tank home with her. Staff informed pts daughter that she could not take the oxygen tank home. Pts daughter was rude, yelling, and cursing at staff. Staff informed pt and pts daughter that we recommend pt staying because oxygen levels were very low without oxygen. Pts daughter continued to yell and curse at staff.

## 2020-06-25 NOTE — ED Notes (Signed)
Called to give report, RN unavailable and Artist will call back asap for report.

## 2020-06-25 NOTE — ED Notes (Signed)
Adela Lank, DO made aware of hard stick and approved the one set of cultures at this time

## 2020-06-25 NOTE — ED Provider Notes (Signed)
Emergency Medicine Provider Triage Evaluation Note  Amanda Stanton , a 87 y.o. female  was evaluated in triage.  Pt complains of SOB and wheezing.  Has had decreased response to home meds.  Has hx of dementia.  Was directed to ED by [facility].  Review of Systems  Positive: SOB, wheezing Negative: fever  Physical Exam  BP (!) 149/65 (BP Location: Left Arm)   Pulse 75   Temp 98.3 F (36.8 C) (Oral)   Resp 18   Ht 5\' 3"  (1.6 m)   Wt 68 kg   SpO2 93%   BMI 26.56 kg/m  Gen:   Awake, no distress   Resp:  Normal effort  MSK:   Moves extremities without difficulty  Other:  Dementia  Medical Decision Making  Medically screening exam initiated at 12:37 AM.  Appropriate orders placed.  Amanda Stanton was informed that the remainder of the evaluation will be completed by another provider, this initial triage assessment does not replace that evaluation, and the importance of remaining in the ED until their evaluation is complete.   Dr. Tod Stanton - [facility] - Advised patient be see for emergent treatment for SOB.  OK'd full workup.   Amanda Schultze, PA-C 06/25/20 06/27/20    [id number], April, MD 06/25/20 06/27/20

## 2020-06-25 NOTE — ED Triage Notes (Signed)
Patient with [facility] from home, coming in for shortness of breath, denies pulmonary hx   20 albuterol 1 atrovent 84% RA, 3L [place] 90%  Hx of dementia, non-verbal, lives with daughter

## 2020-06-25 NOTE — ED Notes (Signed)
Daughter [name] [phone]) is going home to rest and would like to be called with any updates.

## 2020-06-25 NOTE — ED Notes (Signed)
Family member very upset about wait time. Want to leave and take mother home with hospital oxygen tank say mother cant breath without oxygen but does not want to stay and be seen. Pt taken AMA.

## 2020-06-25 NOTE — ED Provider Notes (Signed)
[facility] EMERGENCY DEPARTMENT Provider Note   CSN: [id number] Arrival date & time: 06/25/20  0006     History Chief Complaint  Patient presents with   Shortness of Breath    Amanda Stanton is a 87 y.o. female.  87 yo  F with a chief complaints of difficulty breathing.  Going on for about 3 to 4 days now.  Having some mild coughing.  Patient is nonverbal at baseline and so difficulty obtaining further history.  Level 5 caveat.  The history is provided by the patient and a relative.  Shortness of Breath Severity:  Moderate Onset quality:  Gradual Duration:  3 days Timing:  Constant Progression:  Worsening Chronicity:  New Relieved by:  Nothing Worsened by:  Nothing Ineffective treatments:  None tried Associated symptoms: cough   Associated symptoms: no chest pain, no fever, no headaches, no vomiting and no wheezing       Past Medical History:  Diagnosis Date   CHF (congestive heart failure) (HCC)    Dementia (HCC)    Diabetes mellitus without complication (HCC)    Hypertension     Patient Active Problem List   Diagnosis Date Noted   Hypertension 05/29/2017   Dementia (HCC) 05/29/2017   DM (diabetes mellitus) (HCC) 05/29/2017   CHF (congestive heart failure) (HCC) 05/29/2017   Acute respiratory distress 05/29/2017   Fever 05/29/2017    History reviewed. No pertinent surgical history.   OB History   No obstetric history on file.     History reviewed. No pertinent family history.  Social History   Tobacco Use   Smoking status: Never   Smokeless tobacco: Never  Substance Use Topics   Alcohol use: No    Home Medications Prior to Admission medications   Medication Sig Start Date End Date Taking? Authorizing Provider  acetaminophen (TYLENOL) 160 MG/5ML solution Take 480 mg by mouth every 12 (twelve) hours as needed for headache (pain).   Yes [provider]  albuterol (VENTOLIN HFA) 108 (90 Base) MCG/ACT inhaler Inhale 2  puffs into the lungs every 4 (four) hours as needed for wheezing.   Yes [provider]  Cholecalciferol (VITAMIN D3) 1.25 MG (50000 UT) CAPS Take 50,000 Units by mouth every 30 (thirty) days. On or about the 15th of each month   Yes [provider]  citalopram (CELEXA) 20 MG tablet Take 20 mg by mouth at bedtime.   Yes [provider]  furosemide (LASIX) 20 MG tablet Take 20 mg by mouth every morning.   Yes [provider]  guaifenesin (ROBITUSSIN) 100 MG/5ML syrup Take 200 mg by mouth every 4 (four) hours as needed for cough.   Yes [provider]  LORazepam (ATIVAN) 0.5 MG tablet Take 0.5 mg by mouth at bedtime. For anxiety/insomnia   Yes [provider]  vitamin B-12 (CYANOCOBALAMIN) 1000 MCG tablet Take 1,000 mcg by mouth every morning.   Yes [provider]  Zinc Oxide (BALMEX EX) Apply 1 application topically 2 (two) times daily. Apply to buttocks   Yes [provider]    Allergies    Aricept [donepezil hcl]  Review of Systems   Review of Systems  Constitutional:  Negative for chills and fever.  HENT:  Negative for congestion and rhinorrhea.   Eyes:  Negative for redness and visual disturbance.  Respiratory:  Positive for cough and shortness of breath. Negative for wheezing.   Cardiovascular:  Negative for chest pain and palpitations.  Gastrointestinal:  Negative  for nausea and vomiting.  Genitourinary:  Negative for dysuria and urgency.  Musculoskeletal:  Negative for arthralgias and myalgias.  Skin:  Negative for pallor and wound.  Neurological:  Negative for dizziness and headaches.   Physical Exam Updated Vital Signs BP (!) 164/91   Pulse 68   Temp 98.2 F (36.8 C) (Rectal)   Resp 19   Ht  (1.6 m)   Wt 68 kg   SpO2 92%   BMI 26.56 kg/m   Physical Exam Vitals and nursing note reviewed.  Constitutional:      General: She is not in acute distress.    Appearance: She is well-developed. She  is not diaphoretic.  HENT:     Head: Normocephalic and atraumatic.  Eyes:     Pupils: Pupils are equal, round, and reactive to light.  Neck:     Vascular: JVD (to mid neck) present.  Cardiovascular:     Rate and Rhythm: Normal rate and regular rhythm.     Heart sounds: No murmur heard.   No friction rub. No gallop.  Pulmonary:     Effort: Pulmonary effort is normal.     Breath sounds: No wheezing or rales.  Abdominal:     General: There is no distension.     Palpations: Abdomen is soft.     Tenderness: There is no abdominal tenderness.  Musculoskeletal:        General: No tenderness.     Cervical back: Normal range of motion and neck supple.     Comments: Trace edema BLE  Skin:    General: Skin is warm and dry.  Neurological:     Mental Status: She is alert and oriented to person, place, and time.  Psychiatric:        Behavior: Behavior normal.    ED Results / Procedures / Treatments   Labs (all labs ordered are listed, but only abnormal results are displayed) Labs Reviewed  CBC WITH DIFFERENTIAL/PLATELET - Abnormal; Notable for the following components:      Result Value   Hemoglobin 10.6 (*)    HCT 35.3 (*)    RDW 16.5 (*)    All other components within normal limits  BASIC METABOLIC PANEL - Abnormal; Notable for the following components:   BUN 7 (*)    Calcium 8.2 (*)    All other components within normal limits  BRAIN NATRIURETIC PEPTIDE - Abnormal; Notable for the following components:   B Natriuretic Peptide 185.0 (*)    All other components within normal limits  LACTIC ACID, PLASMA - Abnormal; Notable for the following components:   Lactic Acid, Venous 2.4 (*)    All other components within normal limits  URINALYSIS, ROUTINE W REFLEX MICROSCOPIC - Abnormal; Notable for the following components:   Color, Urine AMBER (*)    APPearance CLOUDY (*)    All other components within normal limits  LACTIC ACID, PLASMA - Abnormal; Notable for the following  components:   Lactic Acid, Venous 2.3 (*)    All other components within normal limits  I-STAT CHEM 8, ED - Abnormal; Notable for the following components:   Calcium, Ion 1.06 (*)    Hemoglobin 11.6 (*)    HCT 34.0 (*)    All other components within normal limits  TROPONIN I (HIGH SENSITIVITY) - Abnormal; Notable for the following components:   Troponin I (High Sensitivity) 24 (*)    All other components within normal limits  RESP PANEL BY RT-PCR (FLU A&B,  COVID) ARPGX2  CULTURE, BLOOD (ROUTINE X 2)  CULTURE, BLOOD (ROUTINE X 2)  LACTIC ACID, PLASMA  LACTIC ACID, PLASMA  TROPONIN I (HIGH SENSITIVITY)  TROPONIN I (HIGH SENSITIVITY)    EKG EKG Interpretation  Date/Time:  Monday June 25 2020 00:30:09 EDT Ventricular Rate:  87 PR Interval:  112 QRS Duration: 92 QT Interval:  558 QTC Calculation: 671 R Axis:   -67 Text Interpretation: Long QTc Sinus rhythm with Premature atrial complexes Left axis deviation Incomplete right bundle branch block Minimal voltage criteria for LVH, may be normal variant ( Cornell product ) Nonspecific ST and T wave abnormality Prolonged QT Abnormal ECG Otherwise no significant change Confirmed by Melene Plan [phone]) on 06/25/2020 9:34:49 AM  Radiology DG Chest 2 View  Result Date: 06/25/2020 CLINICAL DATA:  Shortness of breath.  Wheezing. EXAM: CHEST - 2 VIEW.  Patient is rotated. COMPARISON:  Chest x-ray 05/31/2017 FINDINGS: The heart size and mediastinal contours are unchanged. No definite focal consolidation. Increased interstitial markings. Blunting of bilateral costophrenic angles. Trace bilateral pleural effusions. No pneumothorax. No acute osseous abnormality. IMPRESSION: Pulmonary edema with trace bilateral pleural effusions. Electronically Signed   By: Tish Frederickson M.D.   On: 06/25/2020 01:37   CT Angio Chest PE W and/or Wo Contrast  Result Date: 06/25/2020 CLINICAL DATA:  Suspected pulmonary embolism. EXAM: CT ANGIOGRAPHY CHEST WITH CONTRAST  TECHNIQUE: Multidetector CT imaging of the chest was performed using the standard protocol during bolus administration of intravenous contrast. Multiplanar CT image reconstructions and MIPs were obtained to evaluate the vascular anatomy. CONTRAST:  50mL OMNIPAQUE IOHEXOL 350 MG/ML SOLN COMPARISON:  Chest x-ray from June 25, 2020. FINDINGS: Cardiovascular: Calcified and noncalcified atheromatous plaque of the thoracic aorta. Nonaneurysmal caliber. Aorta is not well opacified. No definite signs of acute aortic process. The heart size is moderately enlarged without pericardial effusion. RIGHT heart enlargement. Reflux of contrast into the hepatic veins during injection. Central pulmonary vasculature with density of 476 Hounsfield units, pulmonary vascular bed is well opacified. Some limited assessment at the distal segmental and subsegmental level at the lung bases, no visible pulmonary embolism Mediastinum/Nodes: No adenopathy in the chest. Mild fullness of RIGHT hilar nodal tissue 1 cm. No thoracic inlet or axillary lymphadenopathy. No gross esophageal abnormality. Lungs/Pleura: Well-circumscribed pulmonary nodule in the RIGHT middle lobe (image 83/6) 6 mm. Septal thickening and small bilateral pleural effusions. Airways are patent. Upper Abdomen: Incidental imaging of upper abdominal contents without acute process. Musculoskeletal: Spinal degenerative changes. No acute bone finding or destructive bone process. Review of the MIP images confirms the above findings. IMPRESSION: 1. No pulmonary embolism. Mildly limited assessment of the lung bases due to respiratory motion as described. 2. Signs of pulmonary edema and pleural effusions. Constellation of findings most suggestive of heart failure. 3. Moderate cardiomegaly with signs of right heart enlargement and reflux of contrast into the hepatic veins during injection. This can be seen in the setting of RIGHT heart dysfunction. 4. 6 mm RIGHT middle lobe pulmonary  nodule. Non-contrast chest CT at 6-12 months is recommended. If the nodule is stable at time of repeat CT, then future CT at 18-24 months (from today's scan) is considered optional for low-risk patients, but is recommended for high-risk patients. This recommendation follows the consensus statement: Guidelines for Management of Incidental Pulmonary Nodules Detected on CT Images: From the Fleischner Society 2017; Radiology 2017; 284:228-243. 5.  Aortic Atherosclerosis (ICD10-I70.0). Aortic atherosclerosis. Electronically Signed   By: Donzetta Kohut M.D.   On: 06/25/2020  13:44    Procedures Procedures  Procedure note: Ultrasound Guided Peripheral IV Ultrasound guided peripheral 1.88 inch angiocath IV placement performed by me. Indications: Nursing unable to place IV. Details: The antecubital fossa and upper arm were evaluated with a multifrequency linear probe. Patent brachial veins were noted. 1 attempt was made to cannulate a vein under realtime US guidance with successful cannulation of the vein and catheter placement. There is return of non-pulsatile dark red blood. The patient tolerated the procedure well without complications. Images archived electronically.  CPT codes: 84696 and [phone]  Medications Ordered in ED Medications  furosemide (LASIX) injection 20 mg (20 mg Intravenous Given 06/25/20 1330)  iohexol (OMNIPAQUE) 350 MG/ML injection 50 mL (50 mLs Intravenous Contrast Given 06/25/20 1313)    ED Course  I have reviewed the triage vital signs and the nursing notes.  Pertinent labs & imaging results that were available during my care of the patient were reviewed by me and considered in my medical decision making (see chart for details).    MDM Rules/Calculators/A&P                          87 yo F with a significant past medical history of dementia comes in with a chief complaints of shortness of breath.  Going on for a few days now.  Found to have an oxygen saturation in the 70s.   Improvements with nasal cannula.  Chest x-ray with possible pulmonary edema.  Does have a history of fluid overload in the past and is on Lasix.  No missed doses except while waiting to be seen here per the family.  We will obtain a laboratory evaluation.  Mild lactate elevation.  CT scan of the chest ordered to evaluate for PE.  Negative no obvious occult pneumonia.  Most likely heart failure.  As the patient was requiring oxygen and not typically on oxygen at home discussed with medicine for admission.  CRITICAL CARE Performed by: Rae Roam   Total critical care time: 35 minutes  Critical care time was exclusive of separately billable procedures and treating other patients.  Critical care was necessary to treat or prevent imminent or life-threatening deterioration.  Critical care was time spent personally by me on the following activities: development of treatment plan with patient and/or surrogate as well as nursing, discussions with consultants, evaluation of patient's response to treatment, examination of patient, obtaining history from patient or surrogate, ordering and performing treatments and interventions, ordering and review of laboratory studies, ordering and review of radiographic studies, pulse oximetry and re-evaluation of patient's condition.  The patients results and plan were reviewed and discussed.   Any x-rays performed were independently reviewed by myself.   Differential diagnosis were considered with the presenting HPI.  Medications  furosemide (LASIX) injection 20 mg (20 mg Intravenous Given 06/25/20 1330)  iohexol (OMNIPAQUE) 350 MG/ML injection 50 mL (50 mLs Intravenous Contrast Given 06/25/20 1313)    Vitals:   06/25/20 1152 06/25/20 1245 06/25/20 1345 06/25/20 1430  BP:  (!) 173/89 (!) 159/86 (!) 164/91  Pulse:  (!) 58 68   Resp:  18 (!) 29 19  Temp: 98.2 F (36.8 C)     TempSrc: Rectal     SpO2:  99% 92%   Weight:      Height:        Final  diagnoses:  Acute respiratory failure with hypoxia (HCC)  Acute on chronic systolic congestive heart failure (HCC)  Admission/ observation were discussed with the admitting physician, patient and/or family and they are comfortable with the plan.    Final Clinical Impression(s) / ED Diagnoses Final diagnoses:  Acute respiratory failure with hypoxia (HCC)  Acute on chronic systolic congestive heart failure [facility])    Rx / DC Orders ED Discharge Orders     None        Melene Plan, DO 06/25/20 1509

## 2020-06-26 ENCOUNTER — Observation Stay (HOSPITAL_BASED_OUTPATIENT_CLINIC_OR_DEPARTMENT_OTHER): Payer: Medicare (Managed Care)

## 2020-06-26 ENCOUNTER — Encounter (HOSPITAL_COMMUNITY): Payer: Self-pay | Admitting: Family Medicine

## 2020-06-26 DIAGNOSIS — R911 Solitary pulmonary nodule: Secondary | ICD-10-CM | POA: Diagnosis present

## 2020-06-26 DIAGNOSIS — F0150 Vascular dementia without behavioral disturbance: Secondary | ICD-10-CM | POA: Diagnosis not present

## 2020-06-26 DIAGNOSIS — Z7189 Other specified counseling: Secondary | ICD-10-CM

## 2020-06-26 DIAGNOSIS — E1151 Type 2 diabetes mellitus with diabetic peripheral angiopathy without gangrene: Secondary | ICD-10-CM

## 2020-06-26 DIAGNOSIS — I5023 Acute on chronic systolic (congestive) heart failure: Secondary | ICD-10-CM | POA: Diagnosis not present

## 2020-06-26 DIAGNOSIS — I509 Heart failure, unspecified: Secondary | ICD-10-CM

## 2020-06-26 DIAGNOSIS — R0609 Other forms of dyspnea: Secondary | ICD-10-CM

## 2020-06-26 DIAGNOSIS — R9431 Abnormal electrocardiogram [ECG] [EKG]: Secondary | ICD-10-CM | POA: Diagnosis present

## 2020-06-26 DIAGNOSIS — Z515 Encounter for palliative care: Secondary | ICD-10-CM | POA: Diagnosis not present

## 2020-06-26 DIAGNOSIS — J9601 Acute respiratory failure with hypoxia: Secondary | ICD-10-CM

## 2020-06-26 DIAGNOSIS — R0602 Shortness of breath: Secondary | ICD-10-CM

## 2020-06-26 DIAGNOSIS — I5033 Acute on chronic diastolic (congestive) heart failure: Secondary | ICD-10-CM | POA: Diagnosis present

## 2020-06-26 DIAGNOSIS — Z66 Do not resuscitate: Secondary | ICD-10-CM

## 2020-06-26 HISTORY — DX: Heart failure, unspecified: I50.9

## 2020-06-26 LAB — CBC
HCT: 35.9 % — ABNORMAL LOW (ref 36.0–46.0)
Hemoglobin: 11.3 g/dL — ABNORMAL LOW (ref 12.0–15.0)
MCH: 26.3 pg (ref 26.0–34.0)
MCHC: 31.5 g/dL (ref 30.0–36.0)
MCV: 83.5 fL (ref 80.0–100.0)
Platelets: 236 10*3/uL (ref 150–400)
RBC: 4.3 MIL/uL (ref 3.87–5.11)
RDW: 16.4 % — ABNORMAL HIGH (ref 11.5–15.5)
WBC: 6.7 10*3/uL (ref 4.0–10.5)
nRBC: 0 % (ref 0.0–0.2)

## 2020-06-26 LAB — GLUCOSE, CAPILLARY
Glucose-Capillary: 96 mg/dL (ref 70–99)
Glucose-Capillary: 87 mg/dL (ref 70–99)

## 2020-06-26 LAB — ECHOCARDIOGRAM COMPLETE
Area-P 1/2: 2.29 cm2
Height: 63 in
S' Lateral: 2.5 cm
Weight: 1830.7 oz

## 2020-06-26 LAB — BASIC METABOLIC PANEL
Anion gap: 11 (ref 5–15)
BUN: 7 mg/dL — ABNORMAL LOW (ref 8–23)
CO2: 25 mmol/L (ref 22–32)
Calcium: 8.7 mg/dL — ABNORMAL LOW (ref 8.9–10.3)
Chloride: 104 mmol/L (ref 98–111)
Creatinine, Ser: 0.67 mg/dL (ref 0.44–1.00)
GFR, Estimated: >60 mL/min (ref >60)
Glucose, Bld: 108 mg/dL — ABNORMAL HIGH (ref 70–99)
Potassium: 3.5 mmol/L (ref 3.5–5.1)
Sodium: 140 mmol/L (ref 135–145)

## 2020-06-26 LAB — TSH: TSH: 2.727 u[IU]/mL (ref 0.350–4.500)

## 2020-06-26 MED ORDER — FUROSEMIDE 10 MG/ML IJ SOLN
20.0000 mg | Freq: Once | INTRAMUSCULAR | Status: AC
  Administered 2020-06-26: 20 mg via INTRAVENOUS
  Filled 2020-06-26: qty 2

## 2020-06-26 NOTE — Progress Notes (Signed)
Family Medicine Teaching Service Daily Progress Note Intern Pager: [phone]  Patient name: Amanda Stanton Medical record number: [id number] Date of birth: 04-Nov-1931 Age: 87 y.o. Gender: female  Primary Care Provider: Jethro Bastos, MD Consultants: None Code Status: Full  Pt Overview and Major Events to Date:  6/20 - Admitted  Assessment and Plan: Amanda Stanton is a 87 y.o. female presenting with labored breathing, found to have fluid overload. PMH significant for dementia and HTN  Acute on chronic heart failure  Overnight patient required 3L HFNC, weaned down to room air in the room with O2 saturations 92-96%%. Patient received total of 2 doses of IV lasix 20mg  with total output of (Net  - ). Kidney function normal and stable with Cr of 0.67. TSH normal at 2.727. Blood culture with NGTD. - Follow-up echocardiogram - Follow-up A1c - PT/OT/SLP eval and treat - Strict Is&Os - Daily weights - Reorder IV Lasix 20mg  - Continue to monitor cultures  HTN BP range since admission: 149-183/66-82. Patient without home medications. Patient possibly with an element of fluid overload, will give another IV dose of Lasix and monitor. - Continue to monitor BP  ?Hx of T2DM? A1c in 2010 was elevated at 7.9. Patient had lower blood sugar of 89 this morning and has been NPO as speech has not yet evaluated the patient.  - Follow-up A1c - Awaiting speech eval for diet   All other chronic conditions per H&P   FEN/GI: NPO, awaiting speech eval for diet PPx: Lovenox   Status is: Observation  The patient remains OBS appropriate and will d/c before 2 midnights.  Dispo: The patient is from: Home              Anticipated d/c is to: Home              Patient currently is not medically stable to d/c.   Difficult to place patient No      Subjective:  Patient is non-verbal. Nurse reported sugar was a bit low this morning but patient has been doing well overall.    Objective: Temp:  [97.6 F (36.4 C)-98.3 F (36.8 C)] 98.3 F (36.8 C) (06/21 0400) Pulse Rate:  [58-80] 67 (06/21 0400) Resp:  [15-29] 17 (06/21 0400) BP: (149-183)/(66-113) 156/66 (06/21 0400) SpO2:  [92 %-100 %] 100 % (06/21 0400) Weight:  [51.9 kg-52.3 kg] 51.9 kg (06/21 0500) Physical Exam: General: NAD, supine in bed, sleeping comfortably Cardiovascular: RRR, systolic murmur Respiratory: breathing comfortably on 3L, weaned down to room air and continued to breath comfortably Abdomen: soft, non-tender, non-distended Extremities: moving all spontaneously  Laboratory: Recent Labs  Lab 06/25/20 1137 06/25/20 1146 06/26/20 0005  WBC 5.5  --  6.7  HGB 10.6* 11.6* 11.3*  HCT 35.3* 34.0* 35.9*  PLT 205  --  236   Recent Labs  Lab 06/25/20 1137 06/25/20 1146 06/26/20 0005  NA 141 145 140  K 3.6 3.6 3.5  CL 109 107 104  CO2 26  --  25  BUN 7* 8 7*  CREATININE 0.58 0.60 0.67  CALCIUM 8.2*  --  8.7*  GLUCOSE 94 93 108*     Imaging/Diagnostic Tests: CT Angio Chest PE W and/or Wo Contrast  Result Date: 06/25/2020 CLINICAL DATA:  Suspected pulmonary embolism. EXAM: CT ANGIOGRAPHY CHEST WITH CONTRAST TECHNIQUE: Multidetector CT imaging of the chest was performed using the standard protocol during bolus administration of intravenous contrast. Multiplanar CT image reconstructions and MIPs were obtained to evaluate the vascular anatomy.  CONTRAST:  14mL OMNIPAQUE IOHEXOL 350 MG/ML SOLN COMPARISON:  Chest x-ray from June 25, 2020. FINDINGS: Cardiovascular: Calcified and noncalcified atheromatous plaque of the thoracic aorta. Nonaneurysmal caliber. Aorta is not well opacified. No definite signs of acute aortic process. The heart size is moderately enlarged without pericardial effusion. RIGHT heart enlargement. Reflux of contrast into the hepatic veins during injection. Central pulmonary vasculature with density of 476 Hounsfield units, pulmonary vascular bed is well opacified. Some  limited assessment at the distal segmental and subsegmental level at the lung bases, no visible pulmonary embolism Mediastinum/Nodes: No adenopathy in the chest. Mild fullness of RIGHT hilar nodal tissue 1 cm. No thoracic inlet or axillary lymphadenopathy. No gross esophageal abnormality. Lungs/Pleura: Well-circumscribed pulmonary nodule in the RIGHT middle lobe (image 83/6) 6 mm. Septal thickening and small bilateral pleural effusions. Airways are patent. Upper Abdomen: Incidental imaging of upper abdominal contents without acute process. Musculoskeletal: Spinal degenerative changes. No acute bone finding or destructive bone process. Review of the MIP images confirms the above findings. IMPRESSION: 1. No pulmonary embolism. Mildly limited assessment of the lung bases due to respiratory motion as described. 2. Signs of pulmonary edema and pleural effusions. Constellation of findings most suggestive of heart failure. 3. Moderate cardiomegaly with signs of right heart enlargement and reflux of contrast into the hepatic veins during injection. This can be seen in the setting of RIGHT heart dysfunction. 4. 6 mm RIGHT middle lobe pulmonary nodule. Non-contrast chest CT at 6-12 months is recommended. If the nodule is stable at time of repeat CT, then future CT at 18-24 months (from today's scan) is considered optional for low-risk patients, but is recommended for high-risk patients. This recommendation follows the consensus statement: Guidelines for Management of Incidental Pulmonary Nodules Detected on CT Images: From the Fleischner Society 2017; Radiology 2017; 284:228-243. 5.  Aortic Atherosclerosis (ICD10-I70.0). Aortic atherosclerosis. Electronically Signed   By: Donzetta Kohut M.D.   On: 06/25/2020 13:44     Evelena Leyden, DO 06/26/2020, 8:27 AM PGY-1, [place] FPTS Intern pager: [phone], text pages welcome

## 2020-06-26 NOTE — Consult Note (Signed)
Consultation Note Date: 06/26/2020   Patient Name: Amanda Stanton  DOB: 02-Aug-1931  MRN: [id number]  Age / Sex: 87 y.o., female  PCP: Janifer Adie, MD Referring Physician: McDiarmid, Blane Ohara, MD  Reason for Consultation: Establishing goals of care "elderly non-verbal female, currently full code"  HPI/Patient Profile: 87 y.o. female  with past medical history of dementia, CHF,  hypertension, ?diabetes, and B12 deficiency who presented to the emergency department on 06/25/2020 with dyspnea x 2-3 days. In the ED, patient initially with O2 saturation in the 70s that improved with 3L nasal cannula. Labs showed BNP 185, lactic acid 2.4, troponin 24, WBC 5.5, Hemoglobin 10.6. Chest x-ray with pulmonary edema and trace bilateral pleural effusions. CTA negative for PE but consistent with signs of heart failure. Patient admitted to [facility] with CHF exacerbation.   Patient receives care services with [facility].   Clinical Assessment and Goals of Care: I have reviewed medical records including EPIC notes, labs and imaging, examined the patient and met at bedside with daughter Fransisco Beau  to discuss diagnosis, prognosis, GOC, EOL wishes, disposition, and options.  I introduced Palliative Medicine as specialized medical care for people living with serious illness. It focuses on providing relief from the symptoms and stress of a serious illness.   We discussed a brief life review of the patient. She is originally from [facility]. After high school, she relocated to [place] where she met her first husband. They had 2 sons together. Sadly, he died in a car accident when the children were very young. About 10 years later, she met her 2nd husband who was a Company secretary. They relocated to [place], where they had their daughter Fransisco Beau.   Patient relocated back to [place] about 8-9 years ago and came to live with [place]. As far as functional status at  baseline, patient is ambulatory. [facility] and is able to walk from the house to the [place]. She requires assistance with all ADLs. She is essentially non-verbal. Regarding nutritional status, she continues to eat well. Fransisco Beau reports she only eats foods that are soft or have been pureed in the food processor - Mashed potatoes, oatmeal, grits, etc. Fransisco Beau is concerned about her diet - that it is too "carb rich". Provided reassurance that patient should be allowed to eat whatever she is able to enjoy and tolerate.   We discussed her current illness and what it means in the larger context of her ongoing co-morbidities.  Natural disease trajectory of dementia and chronic illness was discussed. We reviewed that dementia is a progressive, non-curable disease underlying the patient's current acute medical conditions. We reviewed specific indicators of end-stage dementia including inability to communicate, bed bound/non-ambulatory status, decreased oral intake, and incontinence of bowel/bladder.  I attempted to elicit values and goals of care important to the patient. Fransisco Beau states that it is very important for her mother to remain at home. She also wants her to be as comfortable as possible.   The difference  between full scope medical intervention and comfort care was considered.  I introduced the concept of a comfort path to [facility], emphasizing that this path involves de-escalating and stopping full scope medical interventions, allowing a natural course to occur. Discussed that the goal is comfort and dignity rather than prolonging life. Discussed that when patient becomes non-ambulatory and/or has minimal oral intake, this would indicate end-stage illness and would be appropriate to transition to comfort care.   Discussed use of artifical feeding in patients with dementia. Provided education that research has shown a feeding tube is not beneficial, even temporarily. Discussed that  artificial feeding does not prolong life or promote quality of life in patients with dementia. Fransisco Beau agrees that she would not want a feeding tube for her mother.   We did discuss code status. Encouraged Nakia to consider DNR/DNI status understanding evidenced based poor outcomes in similar hospitalized patients, as the cause of the arrest is likely associated with chronic/terminal disease rather than a reversible acute cardio-pulmonary event. I explained that DNR/DNI does not change the medical plan and it only comes into effect after a person has arrested (died).  It is a protective measure to keep [place] from harming the patient in their last moments of life. Fransisco Beau is agreeable to DNR/DNI.   Questions and concerns were addressed.  The family was encouraged to call with questions or concerns.    Primary decision maker: Latroya Ng [phone] (daughter and legal guardian)    SUMMARY OF RECOMMENDATIONS   Code status changed to DNR/DNI - gold form signed and placed on chart Continue current medical care Goal of care is to return home and continue [facility] services Daughter indicates she would not want artificial feeding  Goals are clear with no additional PMT needs at this time, please call [phone] for urgent needs or need to actively re-engage with this patient and family  Code Status/Advance Care Planning: DNR  Symptom Management:  Per primary team  Palliative Prophylaxis:  Oral Care and Turn Reposition  Additional Recommendations (Limitations, Scope, Preferences): No Artificial Feeding  Psycho-social/Spiritual:  Created space and opportunity for family to express thoughts and feelings regarding patient's current medical situation.  Emotional support provided   Prognosis:  Unable to determine  Discharge Planning: Home with [facility] services     Primary Diagnoses: Present on Admission:  Dyspnea  Acute respiratory failure with hypoxemia (HCC)  Dementia (HCC)  QT prolongation   Pulmonary nodule, right   I have reviewed the medical record, interviewed the patient and family, and examined the patient. The following aspects are pertinent.  Past Medical History:  Diagnosis Date   Acute decompensated heart failure ([place]) 06/26/2020   Acute respiratory distress 05/29/2017   CHF (congestive heart failure) (HCC)    Dementia (HCC)    Diabetes mellitus without complication ([place])    Hypertension      History reviewed. No pertinent family history. Scheduled Meds:  citalopram  20 mg Oral QHS   enoxaparin (LOVENOX) injection  40 mg Subcutaneous Q24H   LORazepam  0.5 mg Oral QHS   vitamin B-12  1,000 mcg Oral q morning   Continuous Infusions: PRN Meds:.acetaminophen **OR** acetaminophen Medications Prior to Admission:  Prior to Admission medications   Medication Sig Start Date End Date Taking? Authorizing Provider  acetaminophen (TYLENOL) 160 MG/5ML solution Take 480 mg by mouth every 12 (twelve) hours as needed for headache (pain).   Yes [provider]  albuterol (VENTOLIN HFA) 108 (90 Base) MCG/ACT inhaler Inhale 2 puffs  into the lungs every 4 (four) hours as needed for wheezing.   Yes [provider]  Cholecalciferol (VITAMIN D3) 1.25 MG (50000 UT) CAPS Take 50,000 Units by mouth every 30 (thirty) days. On or about the 15th of each month   Yes [provider]  citalopram (CELEXA) 20 MG tablet Take 20 mg by mouth at bedtime.   Yes [provider]  furosemide (LASIX) 20 MG tablet Take 20 mg by mouth every morning.   Yes [provider]  guaifenesin (ROBITUSSIN) 100 MG/5ML syrup Take 200 mg by mouth every 4 (four) hours as needed for cough.   Yes [provider]  LORazepam (ATIVAN) 0.5 MG tablet Take 0.5 mg by mouth at bedtime. For anxiety/insomnia   Yes [provider]  vitamin B-12 (CYANOCOBALAMIN) 1000 MCG tablet Take 1,000 mcg by mouth every morning.   Yes [provider]  Zinc Oxide (BALMEX EX)  Apply 1 application topically 2 (two) times daily. Apply to buttocks   Yes [provider]   Allergies  Allergen Reactions   Aricept [Donepezil Hcl] Other (See Comments)    Unknown reaction   Review of Systems  Unable to perform ROS: Dementia   Physical Exam  Vital Signs: BP (!) 156/66 (BP Location: Left Arm)   Pulse 67   Temp 98.3 F (36.8 C) (Oral)   Resp 17   Ht 5' 3" (1.6 m)   Wt 51.9 kg   SpO2 100%   BMI 20.27 kg/m  Pain Scale: Faces   Pain Score: 0-No pain   SpO2: SpO2: 100 % O2 Device:SpO2: 100 % O2 Flow Rate: .O2 Flow Rate (L/min): 3 L/min  IO: Intake/output summary:  Intake/Output Summary (Last 24 hours) at 06/26/2020 1153 Last data filed at 06/26/2020 0402 Gross per 24 hour  Intake 240 ml  Output 1800 ml  Net -1560 ml    LBM:   Baseline Weight: Weight: 68 kg Most recent weight: Weight: 51.9 kg      Palliative Assessment/Data: PPS 40%     Time In: 1420 Time Out: 1535 Time Total: 75 minutes Greater than 50%  of this time was spent counseling and coordinating care related to the above assessment and plan.  Signed by: Lavena Bullion, NP   Please contact Palliative Medicine Team phone at [phone] for questions and concerns.  For individual provider: See Shea Evans

## 2020-06-26 NOTE — Progress Notes (Signed)
  Echocardiogram 2D Echocardiogram has been performed.  Taft Worthing G Cleopatra Sardo 06/26/2020, 1:56 PM

## 2020-06-26 NOTE — Hospital Course (Addendum)
Amanda Stanton is a 87 y.o. female who presented with labored breathing for 3 days with PMH significant for dementia, HTN.   Increased WOB 2/2 HF Exacerbation  Patient with a possible history of CHF on home Lasix presented with labored breathing x3 days.  In the ED, patient was tachypneic and hypoxic to 76% and started on supplemental oxygen at 3L [place]. Labs on admission were notable for elevated lactic acid of 2.4>2.3, BNP of 185, troponin 24>19.  CXR showed pulmonary edema with trace bilateral pleural effusions. CT chest was negative for PE but notable for a nodule as described below.  Echocardiogram showed LVEF 70-75% with hyperdynamic function of LV, mild LVH, grade 1 diastolic dysfunction, flattening of interventricular septum consistent with right ventricular pressure and volume overload, severely elevated pulmonary artery systolic pressure.  Patient was diuresed with IV lasix during hospitalization with significant improvement and transitioned to oral Lasix 40mg  prior to discharge.  Paroxysmal atrial fibrillation Patient in NSR during this examination wounds with some runs of paroxysmal atrial fibrillation on telemetry.  Cardiology was consulted previously for patient's heart failure but also recommended starting Lopressor 12.5 mg twice daily, which is being continued at discharge.  Concern for pyelonephritis  Patient fevered to 101F during hospitalization with no known cause during that time. Blood and urine cultures were collected. Repeat CXR showed improvement in CHF without consolidation. Urinalysis showed leukocytes and bacteria, culture grew >100,000 CFU of Klebsiella oxytoca. Patient was started on ceftriaxone and received 1 dose before transitioning to oral keflex, which shall be continued to finish a total of 7 antibiotic days.   HTN  Patient was hypertensive persistently during hospitalization with systolic typically ranging between 140s-180s. Patient was not on antihypertensive agents prior  to admission.  Patient was not started on thing in the hospital with recommendation for close follow-up outpatient as she is part of the [facility].  Pulmonary nodule on CT scan CT chest showed 48mm right middle lobe nodule with recommendation for repeat CT at 6-12 months following discharge.    Issues for follow up:  Patient with febrile UTI, will be continued on Keflex 500 mg twice daily for total of 7 days (course to finish on 6/28) Right pulmonary nodule will need repeat chest CT in 6-12 months.  Consider 8-12 for CHF.  BP was elevated during hospitalization, consideration to monitor and add oral antihypertensive as appropriate. Monitor heart rate, patient was started on lopressor 12.5mg  BID by cardiology for paroxysmal atrial fibrillation.

## 2020-06-26 NOTE — Plan of Care (Signed)
  Problem: Clinical Measurements: Goal: Ability to maintain clinical measurements within normal limits will improve Outcome: Progressing   Problem: Activity: Goal: Risk for activity intolerance will decrease Outcome: Progressing   Problem: Nutrition: Goal: Adequate nutrition will be maintained Outcome: Progressing   Problem: Coping: Goal: Level of anxiety will decrease Outcome: Progressing   Problem: Pain Managment: Goal: General experience of comfort will improve Outcome: Progressing   

## 2020-06-26 NOTE — Progress Notes (Signed)
Went to bedside as daughter was present and was requesting update. Daughter states that there has been significant improvement in her breathing but is not quite at baseline. Daughter notes that her diet has definitely changed recently, especially with not having her dentures.  Daughter reports that patient has been mainly eating soft foods and when there is meat if the pieces are too big she will "pocket them in her cheek" but swallow the rest of the food.  They have tried using Ensure before in the liquid/drink version gave the patient explosive diarrhea, but she does use the pudding type Ensure at the [facility].  Palliative care stepped into the room during conversation and was continuing the conversation afterwards regarding goals of care and symptom management.  The patient overall seems to be doing better, but would likely benefit from another dose of Lasix.  There is no documented urine yet so unsure the exact response patient has had to the dose from this morning.  Once recorded, we will evaluate if need to increase the dose or discontinue with 20 mg IV. Family is aware that a nutrition consult is in for recommendations.   Jilene Spohr, DO

## 2020-06-26 NOTE — Evaluation (Signed)
Clinical/Bedside Swallow Evaluation Patient Details  Name: Amanda Stanton MRN: [id number] Date of Birth: 12/17/1931  Today's Date: 06/26/2020 Time: SLP Start Time (ACUTE ONLY): 8338 SLP Stop Time (ACUTE ONLY): 0907 SLP Time Calculation (min) (ACUTE ONLY): 15 min  Past Medical History:  Past Medical History:  Diagnosis Date   Acute decompensated heart failure (HCC) 06/26/2020   Acute respiratory distress 05/29/2017   CHF (congestive heart failure) (HCC)    Dementia (HCC)    Diabetes mellitus without complication (HCC)    Hypertension    Past Surgical History: History reviewed. No pertinent surgical history. HPI:  Amanda Stanton is a 87 y.o. female presenting with labored breathing, found to have fluid overload. PMH significant for dementia and HTN. Patient  is nonverbal at baseline. Per daughter, does not follow commands and requires assistance with all tasks including eating. She eats a soft food diet at home.   Assessment / Plan / Recommendation Clinical Impression  Pt demonstrates no acute swallowing impairment. She is alert and aware of PO, responds appropriately to straw or spoon. There is brief oral holding with liquids and suspected delayed swallow followed by eructation, but no signs of aspiraiton. Pt is able to masticate a graham cracker with extra time, but dentition is minimal and there is malocclusion of the mandible and maxilla. Recommend thin liquids and dys 2 ground solids. No SLP f/u needed. SLP Visit Diagnosis: Dysphagia, unspecified (R13.10)    Aspiration Risk  Mild aspiration risk    Diet Recommendation Dysphagia 2 (Fine chop);Thin liquid   Liquid Administration via: Cup;Straw Medication Administration: Whole meds with puree Supervision: Staff to assist with self feeding Compensations: Slow rate;Small sips/bites Postural Changes: Seated upright at 90 degrees    Other  Recommendations Oral Care Recommendations: Oral care BID   Follow up Recommendations 24 hour  supervision/assistance      Frequency and Duration            Prognosis        Swallow Study   General HPI: Amanda Stanton is a 87 y.o. female presenting with labored breathing, found to have fluid overload. PMH significant for dementia and HTN. Patient  is nonverbal at baseline. Per daughter, does not follow commands and requires assistance with all tasks including eating. She eats a soft food diet at home. Type of Study: Bedside Swallow Evaluation Previous Swallow Assessment: none Diet Prior to this Study: NPO Temperature Spikes Noted: No Respiratory Status: Nasal cannula History of Recent Intubation: No Behavior/Cognition: Alert;Cooperative;Doesn't follow directions Oral Cavity Assessment: Within Functional Limits Oral Care Completed by SLP: No Oral Cavity - Dentition: Missing dentition Self-Feeding Abilities: Needs assist Patient Positioning: Upright in bed Baseline Vocal Quality: Not observed Volitional Cough: Cognitively unable to elicit Volitional Swallow: Unable to elicit    Oral/Motor/Sensory Function Overall Oral Motor/Sensory Function: Other (comment) (open mouth posture, poor mandibular occlusion)   Ice Chips Ice chips: Not tested   Thin Liquid Thin Liquid: Impaired Presentation: Straw Oral Phase Functional Implications: Oral holding Pharyngeal  Phase Impairments: Suspected delayed Swallow Other Comments: belch    Nectar Thick Nectar Thick Liquid: Not tested   Honey Thick Honey Thick Liquid: Not tested   Puree Puree: Within functional limits   Solid     Solid: Impaired Oral Phase Impairments: Impaired mastication     Amanda Ditty, MA CCC-SLP  Acute Rehabilitation Services Pager [phone] Office [phone]  Amanda Stanton 06/26/2020,9:08 AM

## 2020-06-26 NOTE — Evaluation (Signed)
Physical Therapy Evaluation and Discharge  Patient Details Name: Elice Crigger MRN: [id number] DOB: 1931-08-30 Today's Date: 06/26/2020   History of Present Illness  87 y.o. female [facility] with SOB x2-3 days. SpO2 in 70's in ED. Patient admitted with increased work of breathing secondary to CHF exacerbation. PMHx significant for CHF, HTN and dementia.  Clinical Impression   Pt admitted secondary to problem above with deficits below. PTA patient was cared for at home by her daughter. Although she is non-verbal and total assist with all ADLs, she was ambulating with supervision to min assist (depending on the day).  Pt currently requires min assist to ambulate (mostly directional cues, especially when returning to chair in her room). Patient is currently at her baseline and RN made aware. No further PT needs indicated.       Follow Up Recommendations No PT follow up;Supervision/Assistance - 24 hour    Equipment Recommendations  None recommended by PT    Recommendations for Other Services       Precautions / Restrictions Precautions Precautions: Fall      Mobility  Bed Mobility Overal bed mobility: Needs Assistance Bed Mobility: Sidelying to Sit   Sidelying to sit: Mod assist;HOB elevated       General bed mobility comments: pt in rt sidelying on arrival; unable to understand verbal instructions for come to sit EOB; once legs manually moved over EOB, pt began to raise her torso to sitting; required mod assist to scoot out to edge and reach feet to floor    Transfers Overall transfer level: Needs assistance Equipment used: 1 person hand held assist Transfers: Sit to/from Stand Sit to Stand: Mod assist         General transfer comment: manual facilitation through LUE--guiding arm forward and up--and at waist to cue to stand; once pt understood she required min assist to complete  Ambulation/Gait Ambulation/Gait assistance: Min assist Gait Distance (Feet): 25  Feet Assistive device: 1 person hand held assist Gait Pattern/deviations: Step-through pattern;Decreased stride length;Narrow base of support Gait velocity: decr   General Gait Details: pt easily guided with HHA and facilitation at waist (when needed to facilitate change in direction); pt with difficulty stepping backwards, able to facilitate weight-shift and pt followed to step backwards  Stairs            Wheelchair Mobility    Modified Rankin (Stroke Patients Only)       Balance Overall balance assessment: Needs assistance Sitting-balance support: No upper extremity supported;Feet unsupported Sitting balance-Leahy Scale: Fair     Standing balance support: Single extremity supported Standing balance-Leahy Scale: Fair                               Pertinent Vitals/Pain Pain Assessment: Faces Faces Pain Scale: No hurt    Home Living Family/patient expects to be discharged to:: Private residence Living Arrangements: Children Available Help at Discharge: Family;Available 24 hours/day             Additional Comments: pt unable; forgot to ask daughter home set-up and DME questions 6/21    Prior Function Level of Independence: Needs assistance   Gait / Transfers Assistance Needed: walks without assist (but at times needs assist); per daughter she will need more assist the more you try to help her and is capable of following daughter around house  ADL's / Homemaking Assistance Needed: dependent with eating, bathing, dressing  Hand Dominance        Extremity/Trunk Assessment   Upper Extremity Assessment Upper Extremity Assessment: Defer to OT evaluation    Lower Extremity Assessment Lower Extremity Assessment: Generalized weakness    Cervical / Trunk Assessment Cervical / Trunk Assessment: Kyphotic  Communication   Communication: Other (comment) (non-verbal)  Cognition Arousal/Alertness: Awake/alert Behavior During Therapy:  Restless (likes to grab hold of objects with either hand (bed linen, PT's name badge)) Overall Cognitive Status: History of cognitive impairments - at baseline                                 General Comments: per discussion with daughter via phone pt appears to be at baseline cognitively (dementia, non-verbal)      General Comments General comments (skin integrity, edema, etc.): Spoke with daughter via phone prior to evaluation.    Exercises     Assessment/Plan    PT Assessment Patent does not need any further PT services  PT Problem List         PT Treatment Interventions      PT Goals (Current goals can be found in the Care Plan section)  Acute Rehab PT Goals Patient Stated Goal: unable to state; per daughter keep pt mobile PT Goal Formulation: All assessment and education complete, DC therapy    Frequency     Barriers to discharge        Co-evaluation               AM-PAC PT "6 Clicks" Mobility  Outcome Measure Help needed turning from your back to your side while in a flat bed without using bedrails?: A Lot Help needed moving from lying on your back to sitting on the side of a flat bed without using bedrails?: A Lot Help needed moving to and from a bed to a chair (including a wheelchair)?: A Lot Help needed standing up from a chair using your arms (e.g., wheelchair or bedside chair)?: A Lot Help needed to walk in hospital room?: A Little Help needed climbing 3-5 steps with a railing? : Total 6 Click Score: 12    End of Session Equipment Utilized During Treatment: Gait belt Activity Tolerance: Patient tolerated treatment well Patient left: in chair;with call bell/phone within reach;with chair alarm set Nurse Communication: Mobility status;Other (comment) (per daughter pt is total assist with feeding and uses a straw) PT Visit Diagnosis: Other abnormalities of gait and mobility (R26.89);Muscle weakness (generalized) (M62.81)    Time:  1856-3149 PT Time Calculation (min) (ACUTE ONLY): 22 min   Charges:   PT Evaluation $PT Eval Moderate Complexity: 1 Mod           Jerolyn Center, PT Pager [phone]   Zena Amos 06/26/2020, 11:40 AM

## 2020-06-27 ENCOUNTER — Encounter (HOSPITAL_COMMUNITY): Payer: Self-pay | Admitting: Family Medicine

## 2020-06-27 ENCOUNTER — Observation Stay (HOSPITAL_COMMUNITY): Payer: Medicare (Managed Care)

## 2020-06-27 DIAGNOSIS — I272 Pulmonary hypertension, unspecified: Secondary | ICD-10-CM

## 2020-06-27 DIAGNOSIS — Z79899 Other long term (current) drug therapy: Secondary | ICD-10-CM | POA: Diagnosis not present

## 2020-06-27 DIAGNOSIS — F0150 Vascular dementia without behavioral disturbance: Secondary | ICD-10-CM | POA: Diagnosis not present

## 2020-06-27 DIAGNOSIS — N390 Urinary tract infection, site not specified: Secondary | ICD-10-CM | POA: Diagnosis present

## 2020-06-27 DIAGNOSIS — F0390 Unspecified dementia without behavioral disturbance: Secondary | ICD-10-CM | POA: Diagnosis present

## 2020-06-27 DIAGNOSIS — I471 Supraventricular tachycardia: Secondary | ICD-10-CM | POA: Diagnosis not present

## 2020-06-27 DIAGNOSIS — J9601 Acute respiratory failure with hypoxia: Secondary | ICD-10-CM | POA: Diagnosis present

## 2020-06-27 DIAGNOSIS — R9431 Abnormal electrocardiogram [ECG] [EKG]: Secondary | ICD-10-CM | POA: Diagnosis not present

## 2020-06-27 DIAGNOSIS — Z66 Do not resuscitate: Secondary | ICD-10-CM | POA: Diagnosis present

## 2020-06-27 DIAGNOSIS — I5033 Acute on chronic diastolic (congestive) heart failure: Secondary | ICD-10-CM

## 2020-06-27 DIAGNOSIS — I5043 Acute on chronic combined systolic (congestive) and diastolic (congestive) heart failure: Secondary | ICD-10-CM | POA: Diagnosis present

## 2020-06-27 DIAGNOSIS — I110 Hypertensive heart disease with heart failure: Secondary | ICD-10-CM | POA: Diagnosis present

## 2020-06-27 DIAGNOSIS — E538 Deficiency of other specified B group vitamins: Secondary | ICD-10-CM | POA: Diagnosis present

## 2020-06-27 DIAGNOSIS — R0600 Dyspnea, unspecified: Secondary | ICD-10-CM | POA: Diagnosis present

## 2020-06-27 DIAGNOSIS — R911 Solitary pulmonary nodule: Secondary | ICD-10-CM | POA: Diagnosis present

## 2020-06-27 DIAGNOSIS — Z6820 Body mass index (BMI) 20.0-20.9, adult: Secondary | ICD-10-CM | POA: Diagnosis not present

## 2020-06-27 DIAGNOSIS — I480 Paroxysmal atrial fibrillation: Secondary | ICD-10-CM | POA: Diagnosis present

## 2020-06-27 DIAGNOSIS — E119 Type 2 diabetes mellitus without complications: Secondary | ICD-10-CM | POA: Diagnosis present

## 2020-06-27 DIAGNOSIS — D6489 Other specified anemias: Secondary | ICD-10-CM | POA: Diagnosis present

## 2020-06-27 DIAGNOSIS — R4701 Aphasia: Secondary | ICD-10-CM

## 2020-06-27 DIAGNOSIS — E43 Unspecified severe protein-calorie malnutrition: Secondary | ICD-10-CM | POA: Diagnosis present

## 2020-06-27 DIAGNOSIS — Z20822 Contact with and (suspected) exposure to covid-19: Secondary | ICD-10-CM | POA: Diagnosis present

## 2020-06-27 DIAGNOSIS — I10 Essential (primary) hypertension: Secondary | ICD-10-CM | POA: Diagnosis not present

## 2020-06-27 HISTORY — DX: Aphasia: R47.01

## 2020-06-27 LAB — URINALYSIS, ROUTINE W REFLEX MICROSCOPIC
Bilirubin Urine: NEGATIVE
Glucose, UA: NEGATIVE mg/dL
Hgb urine dipstick: NEGATIVE
Ketones, ur: NEGATIVE mg/dL
Nitrite: NEGATIVE
Protein, ur: NEGATIVE mg/dL
Specific Gravity, Urine: 1.009 (ref 1.005–1.030)
pH: 7 (ref 5.0–8.0)

## 2020-06-27 LAB — CBC
HCT: 40.8 % (ref 36.0–46.0)
Hemoglobin: 12.7 g/dL (ref 12.0–15.0)
MCH: 26.4 pg (ref 26.0–34.0)
MCHC: 31.1 g/dL (ref 30.0–36.0)
MCV: 84.8 fL (ref 80.0–100.0)
Platelets: 230 10*3/uL (ref 150–400)
RBC: 4.81 MIL/uL (ref 3.87–5.11)
RDW: 16.6 % — ABNORMAL HIGH (ref 11.5–15.5)
WBC: 4.4 10*3/uL (ref 4.0–10.5)
nRBC: 0 % (ref 0.0–0.2)

## 2020-06-27 LAB — BASIC METABOLIC PANEL
Anion gap: 8 (ref 5–15)
BUN: 8 mg/dL (ref 8–23)
CO2: 26 mmol/L (ref 22–32)
Calcium: 8.9 mg/dL (ref 8.9–10.3)
Chloride: 105 mmol/L (ref 98–111)
Creatinine, Ser: 0.61 mg/dL (ref 0.44–1.00)
GFR, Estimated: >60 mL/min (ref >60)
Glucose, Bld: 102 mg/dL — ABNORMAL HIGH (ref 70–99)
Potassium: 3.7 mmol/L (ref 3.5–5.1)
Sodium: 139 mmol/L (ref 135–145)

## 2020-06-27 LAB — HEMOGLOBIN A1C
Hgb A1c MFr Bld: 5.6 % (ref 4.8–5.6)
Mean Plasma Glucose: 114 mg/dL

## 2020-06-27 MED ORDER — METOPROLOL TARTRATE 5 MG/5ML IV SOLN: 2.5000 mg | INTRAVENOUS | Status: DC

## 2020-06-27 MED ORDER — FUROSEMIDE 40 MG PO TABS
40.0000 mg | ORAL_TABLET | Freq: Every day | ORAL | Status: DC
  Administered 2020-06-28: 40 mg via ORAL
  Filled 2020-06-27: qty 1

## 2020-06-27 MED ORDER — METOPROLOL TARTRATE 12.5 MG HALF TABLET
12.5000 mg | ORAL_TABLET | Freq: Two times a day (BID) | ORAL | Status: DC
  Administered 2020-06-27 – 2020-06-28 (×2): 12.5 mg via ORAL
  Filled 2020-06-27 (×2): qty 1

## 2020-06-27 MED ORDER — SODIUM CHLORIDE 0.9 % IV SOLN
1.0000 g | INTRAVENOUS | Status: DC
  Administered 2020-06-27: 1 g via INTRAVENOUS
  Filled 2020-06-27 (×2): qty 10

## 2020-06-27 MED ORDER — ADULT MULTIVITAMIN W/MINERALS CH
1.0000 | ORAL_TABLET | Freq: Every day | ORAL | Status: DC
  Administered 2020-06-27 – 2020-06-28 (×2): 1 via ORAL
  Filled 2020-06-27 (×2): qty 1

## 2020-06-27 MED ORDER — FUROSEMIDE 10 MG/ML IJ SOLN
20.0000 mg | Freq: Once | INTRAMUSCULAR | Status: AC
  Administered 2020-06-27: 20 mg via INTRAVENOUS
  Filled 2020-06-27: qty 2

## 2020-06-27 NOTE — Progress Notes (Addendum)
Family Medicine Teaching Service Daily Progress Note Intern Pager: [phone]  Patient name: Amanda Stanton Medical record number: [id number] Date of birth: 1931/02/02 Age: 87 y.o. Gender: female  Primary Care Provider: Jethro Bastos, MD Consultants: Palliative Code Status: DNR  Pt Overview and Major Events to Date:  6/20-admitted  Assessment and Plan: Amanda Stanton is a 87 y.o. female presenting with labored breathing found to have fluid overload.  PMH significant for dementia and hypertension.  Acute on chronic heart failure exacerbation UOP in the last 24h . Echocardiogram showed EF 70-75% with hyperdynamic LV function, G1DD, flattened interventricular septum c/w right ventricular pressure and volume overload;severely elevated pulmonary artery systolic pressure, trivial mitral valve regurg; tricuspid aortic valve.  Will need re-evaluation from daughter to determine if close to baseline respiratory status. Repeat CXR showed slight improvement with regards to CHF, will redose Lasix. - Monitor UOP - Strict Is&Os - Daily weights - Follow-up repeat CXR - Will discuss with daughter - Lasix IV 20mg    Fever of unknown origin Tmax of 101F overnight, team was not notified of fever. CBC wnl at 4.4. No known source at this time, will get full work-up with blood cultures, urine cultures, urinalysis. CXR showed possible trace residual pleural effusion and some improvement in congestive failure - Blood cultures - UA and urine cultures  Dementia Palliative goals of care discussion on 6/21; patient now DNR. - Palliative consulted, appreciate assistance with patient and family - Continue home medications per H&P  HTN Blood pressure in the last 24h 101-161/66-84. Improvement since admission pressures with diuresis.  - Continue to monitor BP  History of elevated A1c A1c on 6/21 5.6. No need for CBG checks unless concern for hypoglycemic event.    FEN/GI: Dysphagia 2 diet PPx:  Lovenox   Status is: Observation  The patient remains OBS appropriate and will d/c before 2 midnights.  Dispo: The patient is from: Home              Anticipated d/c is to: Home              Patient currently is not medically stable to d/c.   Difficult to place patient No    Subjective:  Patient nonverbal with dementia.   Objective: Temp:  [98.1 F (36.7 C)-101 F (38.3 C)] 98.1 F (36.7 C) (06/22 0515) Pulse Rate:  [72-78] 78 (06/21 1927) Resp:  [18-20] 18 (06/22 0515) BP: (101-161)/(66-84) 143/74 (06/22 0515) SpO2:  [93 %-96 %] 93 % (06/22 0515) Physical Exam: General: NAD, sleeping comfortably Cardiovascular: RRR, systolic murmur appreciated Respiratory: Breathing comfortably on room air, no respiratory distress while sleeping Abdomen: Soft, nontender, nondistended Extremities: Moving all extremities spontaneously, sleeping  Laboratory: Recent Labs  Lab 06/25/20 1137 06/25/20 1146 06/26/20 0005 06/27/20 0611  WBC 5.5  --  6.7 4.4  HGB 10.6* 11.6* 11.3* 12.7  HCT 35.3* 34.0* 35.9* 40.8  PLT 205  --  236 230   Recent Labs  Lab 06/25/20 1137 06/25/20 1146 06/26/20 0005 06/27/20 0611  NA 141 145 140 139  K 3.6 3.6 3.5 3.7  CL 109 107 104 105  CO2 26  --  25 26  BUN 7* 8 7* 8  CREATININE 0.58 0.60 0.67 0.61  CALCIUM 8.2*  --  8.7* 8.9  GLUCOSE 94 93 108* 102*      Imaging/Diagnostic Tests: ECHOCARDIOGRAM COMPLETE  Result Date: 06/26/2020    ECHOCARDIOGRAM REPORT   Patient Name:   Amanda Stanton Date of Exam: 06/26/2020 Medical Rec #:  [id number]    Height:       63.0 in Accession #:    [id number]   Weight:       114.4 lb Date of Birth:  1931-01-31    BSA:          1.525 m Patient Age:    88 years     BP:           156/66 mmHg Patient Gender: F            HR:           72 bpm. Exam Location:  Inpatient Procedure: 2D Echo, Cardiac Doppler and Color Doppler Indications:    Dyspnea R06.00  History:        Patient has no prior history of Echocardiogram  examinations.                 CHF; Risk Factors:Hypertension and Diabetes. Dementia.  Sonographer:    Elmarie Shiley Dance Referring Phys: [id number] DAN FLOYD  Sonographer Comments: Image acquisition challenging due to respiratory motion. IMPRESSIONS  1. Left ventricular ejection fraction, by estimation, is 70 to 75%. The left ventricle has hyperdynamic function. The left ventricle has no regional wall motion abnormalities. There is mild left ventricular hypertrophy. Left ventricular diastolic parameters are consistent with Grade I diastolic dysfunction (impaired relaxation). There is the interventricular septum is flattened in systole and diastole, consistent with right ventricular pressure and volume overload.  2. Right ventricular systolic function is mildly reduced. The right ventricular size is normal. There is severely elevated pulmonary artery systolic pressure.  3. The mitral valve is normal in structure. Trivial mitral valve regurgitation. No evidence of mitral stenosis.  4. The aortic valve is tricuspid. Aortic valve regurgitation is not visualized. No aortic stenosis is present.  5. The inferior vena cava is normal in size with greater than 50% respiratory variability, suggesting right atrial pressure of 3 mmHg. FINDINGS  Left Ventricle: Left ventricular ejection fraction, by estimation, is 70 to 75%. The left ventricle has hyperdynamic function. The left ventricle has no regional wall motion abnormalities. The left ventricular internal cavity size was normal in size. There is mild left ventricular hypertrophy. The interventricular septum is flattened in systole and diastole, consistent with right ventricular pressure and volume overload. Left ventricular diastolic parameters are consistent with Grade I diastolic dysfunction (impaired relaxation). Right Ventricle: The right ventricular size is normal.Right ventricular systolic function is mildly reduced. There is severely elevated pulmonary artery systolic  pressure. The tricuspid regurgitant velocity is 4.41 m/s, and with an assumed right atrial pressure of 3 mmHg, the estimated right ventricular systolic pressure is 80.8 mmHg. Left Atrium: Left atrial size was normal in size. Right Atrium: Right atrial size was normal in size. Pericardium: Trivial pericardial effusion is present. Mitral Valve: The mitral valve is normal in structure. Trivial mitral valve regurgitation. No evidence of mitral valve stenosis. Tricuspid Valve: The tricuspid valve is normal in structure. Tricuspid valve regurgitation is mild . No evidence of tricuspid stenosis. Aortic Valve: The aortic valve is tricuspid. Aortic valve regurgitation is not visualized. No aortic stenosis is present. Pulmonic Valve: The pulmonic valve was normal in structure. Pulmonic valve regurgitation is mild. No evidence of pulmonic stenosis. Aorta: The aortic root is normal in size and structure. Venous: The inferior vena cava is normal in size with greater than 50% respiratory variability, suggesting right atrial pressure of 3 mmHg. IAS/Shunts: No atrial level shunt detected by color flow Doppler.  LEFT VENTRICLE PLAX 2D  LVIDd:         4.50 cm Diastology LVIDs:         2.50 cm LV e' medial:    7.30 cm/s LV PW:         1.00 cm LV E/e' medial:  6.9 LV IVS:        1.20 cm LV e' lateral:   8.45 cm/s                        LV E/e' lateral: 6.0  RIGHT VENTRICLE             IVC RV Basal diam:  2.60 cm     IVC diam: 2.00 cm RV S prime:     11.60 cm/s TAPSE (M-mode): 1.6 cm LEFT ATRIUM             Index       RIGHT ATRIUM           Index LA diam:        4.20 cm 2.75 cm/m  RA Area:     15.10 cm LA Vol (A2C):   64.2 ml 42.10 ml/m RA Volume:   35.40 ml  23.21 ml/m LA Vol (A4C):   33.9 ml 22.23 ml/m LA Biplane Vol: 50.7 ml 33.24 ml/m  AORTIC VALVE LVOT Vmax:   91.60 cm/s LVOT Vmean:  62.150 cm/s LVOT VTI:    0.186 m  AORTA Ao Asc diam: 2.80 cm MITRAL VALVE               TRICUSPID VALVE MV Area (PHT): 2.29 cm    TR Peak grad:    77.8 mmHg MV Decel Time: 331 msec    TR Vmax:        441.00 cm/s MV E velocity: 50.40 cm/s MV A velocity: 81.00 cm/s  SHUNTS MV E/A ratio:  0.62        Systemic VTI: 0.19 m Olga Millers MD Electronically signed by Olga Millers MD Signature Date/Time: 06/26/2020/2:44:14 PM    Final      Evelena Leyden, DO 06/27/2020, 8:25 AM PGY-1, [facility] FPTS Intern pager: [phone], text pages welcome

## 2020-06-27 NOTE — TOC Initial Note (Signed)
Transition of Care [facility]) - Initial/Assessment Note    Patient Details  Name: Amanda Stanton MRN: [id number] Date of Birth: 08/16/1931  [facility] CM/SW Contact:    Beckie Busing, RN Phone [phone]  06/27/2020, 12:26 PM  Clinical Narrative:                 [facility] consulted for patient that is from facility. CM spoke with daughter [name] who states that patient is from home with daughter with care management by [facility]. Daughter states she is the primary caregiver. Daughter plans to bring patient back home when medically stable for discharge. No TOC needs at this time. TOC will continue to follow for possible disposition needs.  Expected Discharge Plan: Home/Self Care Barriers to Discharge: Continued Medical Work up   Patient Goals and CMS Choice Patient states their goals for this hospitalization and ongoing recovery are:: Patient is confused   Choice offered to / list presented to : NA  Expected Discharge Plan and Services Expected Discharge Plan: Home/Self Care In-house Referral: NA Discharge Planning Services: CM Consult Post Acute Care Choice: NA Living arrangements for the past 2 months: Apartment                 DME Arranged: N/A DME Agency: NA       HH Arranged: NA HH Agency: NA        Prior Living Arrangements/Services Living arrangements for the past 2 months: Apartment Lives with:: Adult Children Patient language and need for interpreter reviewed:: Yes Do you feel safe going back to the place where you live?:  (n/a patient confused)      Need for Family Participation in Patient Care: Yes (Comment) Care giver support system in place?: Yes (comment) Current home services:  (n/a) Criminal Activity/Legal Involvement Pertinent to Current Situation/Hospitalization: No - Comment as needed  Activities of Daily Living      Permission Sought/Granted   Permission granted to share information with : No              Emotional  Assessment   Attitude/Demeanor/Rapport: Unable to Assess Affect (typically observed): Unable to Assess Orientation: :  (dementia) Alcohol / Substance Use: Not Applicable Psych Involvement: No (comment)  Admission diagnosis:  Dyspnea [R06.00] Acute on chronic systolic congestive heart failure (HCC) [I50.23] Acute respiratory failure with hypoxia (HCC) [J96.01] Patient Active Problem List   Diagnosis Date Noted   Nonverbal 06/27/2020   Acute respiratory failure with hypoxemia (HCC) 06/26/2020   Acute decompensated heart failure (HCC) 06/26/2020   QT prolongation 06/26/2020   Pulmonary nodule, right 06/26/2020   Dyspnea 06/25/2020   Hypertension associated with diabetes (HCC) 05/29/2017   Dementia (HCC) 05/29/2017   DM (diabetes mellitus) (HCC) 05/29/2017   CHF (congestive heart failure) (HCC) 05/29/2017   Fever 05/29/2017   PCP:  Jethro Bastos, MD Pharmacy:  No Pharmacies Listed    Social Determinants of Health (SDOH) Interventions    Readmission Risk Interventions No flowsheet data found.

## 2020-06-27 NOTE — Progress Notes (Signed)
Interim update:  U/A resulted back with moderate leukocytes, nitrite negative, many bacteria, and WBC 11-20. Urine cultures are pending. We are starting antibiotics with Ceftriaxone 1g daily as patient was febrile overnight.   Aretha Levi, DO

## 2020-06-27 NOTE — Progress Notes (Signed)
Called [facility] to discuss patient's medical history. Dr. Lisabeth Devoid was out of the office and I spoke with [name] instead. Patient does not have any prior visits to cardiology or pulmonology that they are aware of, and the patient has been with their program for many years.   Recent weights: 5/3 - 113.6 lbs 4/1 - 117 lbs March - 118.2 lbs  Patient also has had UTI's in the past with the most recent having been in 2020.    Amanda Tornow, DO

## 2020-06-27 NOTE — Progress Notes (Signed)
Initial Nutrition Assessment  DOCUMENTATION CODES:   Severe malnutrition in context of chronic illness  INTERVENTION:  -Staff to assist pt with meals/snacks/supplements -Switch pt to inappropriate for room service -Magic cup TID with meals, each supplement provides 290 kcal and 9 grams of protein -Snacks TID -MVI with minerals daily  NUTRITION DIAGNOSIS:   Severe Malnutrition related to chronic illness (dementia, CHF) as evidenced by severe fat depletion, severe muscle depletion.  GOAL:   Patient will meet greater than or equal to 90% of their needs  MONITOR:   PO intake, Supplement acceptance, Weight trends, Labs, I & O's  REASON FOR ASSESSMENT:   Consult Assessment of nutrition requirement/status  ASSESSMENT:   Pt with a PMH significant for dementia, DM, CHF, and HTN admitted with increased work of breathing 2/2 CHF exacerbation.  PMT met with pt/family. At this time, would like for pt to continue current medical care but have expressed that they are not interested in pursuing a feeding tube.    Pt nonverbal, does not follow commands, and requires assistance with all ADLs at baseline. Per pt's daughter, pt eats mainly soft foods does best when meat is cut into small portions. Daughter also mentioned that pt does not tolerate Ensure well but can tolerate the pudding.   Limited weight history available for review.   PO Intake: 45% x 1 recorded meal Will provide pt with oral nutrition supplements in hopes of increasing kcal/protein intake. Will also transition to inappropriate for room service given pt is nonverbal and will order meal assistance as pt receives at home.   UOP: 2065ml x24 hours Stool: 2x unmeasured occurrences today  Medications: vitamin B12 Labs reviewed.  CBGs 87-96  NUTRITION - FOCUSED PHYSICAL EXAM:  Flowsheet Row Most Recent Value  Orbital Region Severe depletion  Upper Arm Region Severe depletion  Thoracic and Lumbar Region Severe depletion   Buccal Region Severe depletion  Temple Region Severe depletion  Clavicle Bone Region Severe depletion  Clavicle and Acromion Bone Region Severe depletion  Scapular Bone Region Severe depletion  Dorsal Hand Severe depletion  Patellar Region Severe depletion  Anterior Thigh Region Severe depletion  Posterior Calf Region Severe depletion  Edema (RD Assessment) None  Hair Reviewed  Eyes Reviewed  Mouth Reviewed  Skin Reviewed  Nails Reviewed       Diet Order:   Diet Order             DIET DYS 2 Room service appropriate? No; Fluid consistency: Thin  Diet effective now                   EDUCATION NEEDS:   No education needs have been identified at this time  Skin:  Skin Assessment: Reviewed RN Assessment  Last BM:  6/22 type 5  Height:   Ht Readings from Last 1 Encounters:  06/25/20 $RemoveB'5\' 3"'bEvIWHnw$  (1.6 m)    Weight:   Wt Readings from Last 1 Encounters:  06/26/20 51.9 kg   BMI:  Body mass index is 20.27 kg/m.  Estimated Nutritional Needs:   Kcal:  1600-1800  Protein:  80-90g  Fluid:  >1.6L/d    Larkin Ina, MS, RD, LDN (she/her/hers) RD pager number and weekend/on-call pager number located in [place].

## 2020-06-27 NOTE — Plan of Care (Signed)
  Problem: Safety: Goal: Ability to remain free from injury will improve Outcome: Progressing   Problem: Skin Integrity: Goal: Risk for impaired skin integrity will decrease Outcome: Progressing   Problem: Activity: Goal: Capacity to carry out activities will improve Outcome: Not Progressing   Problem: Cardiac: Goal: Ability to achieve and maintain adequate cardiopulmonary perfusion will improve Outcome: Progressing

## 2020-06-27 NOTE — Consult Note (Signed)
Cardiology Consultation:   Patient ID: Lyndee Herbst MRN: [id number]; DOB: 1931-06-25  Admit date: 06/25/2020 Date of Consult: 06/27/2020  PCP:  Jethro Bastos, MD   [facility] Providers Cardiologist:  None       NEW   Patient Profile:   Kailah Pennel is a 87 y.o. female with a hx of HTN, dementia, CHF in 2019 who is being seen 06/27/2020 for the evaluation of CHF at the request of Dr McDiarmid.  History of Present Illness:   Ms. Levay with above hx and with her dementia she is nonverbal. No known hx of CAD, she is in [facility] program and they have no record of cardiology either.  Similar admit 05/2017 with fever and chf, found to have UTI and bronchitis. No cardiology eval at that time  Pt admitted 06/25/20 with acute SOB and hypoxia, that began 2-3 days prior to admit.  Sats on arrival in the 70s, but improved with 02.  BP on admit 164/91.     EKG:  The EKG was personally reviewed and demonstrates:  SR with PACs and artifact on all EKGs from the 20th.  T wave inversions laterally similar to 2019.  Telemetry:  Telemetry was personally reviewed and demonstrates:  SR with runs of PAT up to HR 140s  Echo with EF 70-75%, LV  with hyperdynamic function. G1DD. There is the interventricular septum is flattened in systole and diastole, consistent with right ventricular pressure and volume overload. RV function mildly reduced severely elevated PA systolic pressure. Trial MR  trivial pericardial effusion is present. The RV systolic pressure is 80.8 mmHg.  (No old to compare)  Labs hs troponin 24,19, 27  BNP 185 Na 139, K+ 3.7 Cr 0.61 Hgb 12.7 WBC4.4 plts 230 TSH 2.727  A1C 5.6   CTA chest  IMPRESSION: 1. No pulmonary embolism. Mildly limited assessment of the lung bases due to respiratory motion as described. 2. Signs of pulmonary edema and pleural effusions. Constellation of findings most suggestive of heart failure. 3. Moderate cardiomegaly with signs of right heart enlargement  and reflux of contrast into the hepatic veins during injection. This can be seen in the setting of RIGHT heart dysfunction. 4. 6 mm RIGHT middle lobe pulmonary nodule. Non-contrast chest CT at 6-12 months is recommended. If the nodule is stable at time of repeat CT, then future CT at 18-24 months (from today's scan) is considered optional for low-risk patients, but is recommended for high-risk patients.  Given lasix 20 mg at a time BID. She is negative 3532 wt d from 52.3 to 51.9 no weights today.   CXR today with slight improvement of CHF.    BP 147/82 P 72 T 99.2 (pk fever 101 ax) cultures pending.  Pt is now DNR   Past Medical History:  Diagnosis Date   Acute decompensated heart failure (HCC) 06/26/2020   Acute respiratory distress 05/29/2017   CHF (congestive heart failure) (HCC)    Dementia (HCC)    Diabetes mellitus without complication (HCC)    Hypertension    Nonverbal 06/27/2020    History reviewed. No pertinent surgical history.   Home Medications:  Prior to Admission medications   Medication Sig Start Date End Date Taking? Authorizing Provider  acetaminophen (TYLENOL) 160 MG/5ML solution Take 480 mg by mouth every 12 (twelve) hours as needed for headache (pain).   Yes [provider]  albuterol (VENTOLIN HFA) 108 (90 Base) MCG/ACT inhaler Inhale 2 puffs into the lungs every 4 (four) hours as needed  for wheezing.   Yes [provider]  Cholecalciferol (VITAMIN D3) 1.25 MG (50000 UT) CAPS Take 50,000 Units by mouth every 30 (thirty) days. On or about the 15th of each month   Yes [provider]  citalopram (CELEXA) 20 MG tablet Take 20 mg by mouth at bedtime.   Yes [provider]  furosemide (LASIX) 20 MG tablet Take 20 mg by mouth every morning.   Yes [provider]  guaifenesin (ROBITUSSIN) 100 MG/5ML syrup Take 200 mg by mouth every 4 (four) hours as needed for cough.   Yes [provider]  LORazepam (ATIVAN) 0.5 MG  tablet Take 0.5 mg by mouth at bedtime. For anxiety/insomnia   Yes [provider]  vitamin B-12 (CYANOCOBALAMIN) 1000 MCG tablet Take 1,000 mcg by mouth every morning.   Yes [provider]  Zinc Oxide (BALMEX EX) Apply 1 application topically 2 (two) times daily. Apply to buttocks   Yes [provider]    Inpatient Medications: Scheduled Meds:  citalopram  20 mg Oral QHS   enoxaparin (LOVENOX) injection  40 mg Subcutaneous Q24H   LORazepam  0.5 mg Oral QHS   vitamin B-12  1,000 mcg Oral q morning   Continuous Infusions:  cefTRIAXone (ROCEPHIN)  IV     PRN Meds: acetaminophen **OR** acetaminophen  Allergies:    Allergies  Allergen Reactions   Aricept [Donepezil Hcl] Other (See Comments)    Unknown reaction    Social History:   Social History   Socioeconomic History   Marital status: Divorced    Spouse name: Not on file   Number of children: Not on file   Years of education: Not on file   Highest education level: Not on file  Occupational History   Not on file  Tobacco Use   Smoking status: Never   Smokeless tobacco: Never  Substance and Sexual Activity   Alcohol use: No   Drug use: Not on file   Sexual activity: Not on file  Other Topics Concern   Not on file  Social History Narrative   Not on file   Social Determinants of Health   Financial Resource Strain: Not on file  Food Insecurity: Not on file  Transportation Needs: Not on file  Physical Activity: Not on file  Stress: Not on file  Social Connections: Not on file  Intimate Partner Violence: Not on file    Family History:   History reviewed. No pertinent family history. None on file and pt can not give FH.  ROS:  Please see the history of present illness. Information obtained from chart as pt is non verbal. General:no colds or fevers, no weight changes Skin:no rashes or ulcers HEENT:no blurred vision, no congestion CV:see HPI PUL:see HPI GI:no diarrhea constipation or  melena, no indigestion GU:no hematuria, no dysuria MS:no joint pain, no claudication Neuro:no syncope, no lightheadedness Endo:+ diabetes, no thyroid disease  All other ROS reviewed and negative.     Physical Exam/Data:   Vitals:   06/26/20 1634 06/26/20 1927 06/27/20 0515 06/27/20 1410  BP: (!) 161/66 101/84 (!) 143/74 (!) 147/82  Pulse: 72 78  72  Resp: Temp: 99.1 F (37.3 C) (!) 101 F (38.3 C) 98.1 F (36.7 C) 99.2 F (37.3 C)  TempSrc: Axillary Axillary Axillary   SpO2: 96% 93% 93% 94%  Weight:      Height:        Intake/Output Summary (Last 24 hours) at 06/27/2020 1500  Last data filed at 06/27/2020 1000 Gross per 24 hour  Intake 380 ml  Output 1552 ml  Net -1172 ml   Last 3 Weights 06/26/2020 06/25/2020 06/25/2020  Weight (lbs) 114 lb 6.7 oz 115 lb 4.8 oz 149 lb 14.6 oz  Weight (kg) 51.9 kg 52.3 kg 68 kg     Body mass index is 20.27 kg/m.  General:  thin, elderly female in no acute distress HEENT: normal Lymph: no adenopathy Neck: no JVD Endocrine:  No thryomegaly Vascular: No carotid bruits; pedal pulses 1+ bilaterally   Cardiac:  normal S1, S2; RRR; no murmur + click no rubs or gallups Lungs:  clear to auscultation bilaterally, no wheezing, rhonchi or rales  Abd: soft, nontender, no hepatomegaly  Ext: no edema Musculoskeletal:  No deformities, BUE and BLE strength normal and equal Skin: warm and dry  Neuro:  alert, does not answer questions, follows some commands she is eating well Psych:  Normal affect   Relevant CV Studies: Echo 06/26/20 IMPRESSIONS     1. Left ventricular ejection fraction, by estimation, is 70 to 75%. The  left ventricle has hyperdynamic function. The left ventricle has no  regional wall motion abnormalities. There is mild left ventricular  hypertrophy. Left ventricular diastolic  parameters are consistent with Grade I diastolic dysfunction (impaired  relaxation). There is the interventricular septum is flattened in  systole  and diastole, consistent with right ventricular pressure and volume  overload.   2. Right ventricular systolic function is mildly reduced. The right  ventricular size is normal. There is severely elevated pulmonary artery  systolic pressure.   3. The mitral valve is normal in structure. Trivial mitral valve  regurgitation. No evidence of mitral stenosis.   4. The aortic valve is tricuspid. Aortic valve regurgitation is not  visualized. No aortic stenosis is present.   5. The inferior vena cava is normal in size with greater than 50%  respiratory variability, suggesting right atrial pressure of 3 mmHg.   FINDINGS   Left Ventricle: Left ventricular ejection fraction, by estimation, is 70  to 75%. The left ventricle has hyperdynamic function. The left ventricle  has no regional wall motion abnormalities. The left ventricular internal  cavity size was normal in size.  There is mild left ventricular hypertrophy. The interventricular septum is  flattened in systole and diastole, consistent with right ventricular  pressure and volume overload. Left ventricular diastolic parameters are  consistent with Grade I diastolic  dysfunction (impaired relaxation).   Right Ventricle: The right ventricular size is normal.Right ventricular  systolic function is mildly reduced. There is severely elevated pulmonary  artery systolic pressure. The tricuspid regurgitant velocity is 4.41 m/s,  and with an assumed right atrial  pressure of 3 mmHg, the estimated right ventricular systolic pressure is  80.8 mmHg.   Left Atrium: Left atrial size was normal in size.   Right Atrium: Right atrial size was normal in size.   Pericardium: Trivial pericardial effusion is present.   Mitral Valve: The mitral valve is normal in structure. Trivial mitral  valve regurgitation. No evidence of mitral valve stenosis.   Tricuspid Valve: The tricuspid valve is normal in structure. Tricuspid  valve regurgitation  is mild . No evidence of tricuspid stenosis.   Aortic Valve: The aortic valve is tricuspid. Aortic valve regurgitation is  not visualized. No aortic stenosis is present.   Pulmonic Valve: The pulmonic valve was normal in structure. Pulmonic valve  regurgitation is mild. No evidence of pulmonic stenosis.  Aorta: The aortic root is normal in size and structure.   Venous: The inferior vena cava is normal in size with greater than 50%  respiratory variability, suggesting right atrial pressure of 3 mmHg.   IAS/Shunts: No atrial level shunt detected by color flow Doppler.       Laboratory Data:  High Sensitivity Troponin:   Recent Labs  Lab 06/25/20 1137 06/25/20 1329 06/25/20 1619  TROPONINIHS 24* 19* 27*     Chemistry Recent Labs  Lab 06/25/20 1137 06/25/20 1146 06/26/20 0005 06/27/20 0611  NA 141 145 140 139  K 3.6 3.6 3.5 3.7  CL 109 107 104 105  CO2 26  --  25 26  GLUCOSE 94 93 108* 102*  BUN 7* 8 7* 8  CREATININE 0.58 0.60 0.67 0.61  CALCIUM 8.2*  --  8.7* 8.9  GFRNONAA >60  --  >60 >60  ANIONGAP 6  --  11 8    No results for input(s): PROT, ALBUMIN, AST, ALT, ALKPHOS, BILITOT in the last 168 hours. Hematology Recent Labs  Lab 06/25/20 1137 06/25/20 1146 06/26/20 0005 06/27/20 0611  WBC 5.5  --  6.7 4.4  RBC 4.04  --  4.30 4.81  HGB 10.6* 11.6* 11.3* 12.7  HCT 35.3* 34.0* 35.9* 40.8  MCV 87.4  --  83.5 84.8  MCH 26.2  --  26.3 26.4  MCHC 30.0  --  31.5 31.1  RDW 16.5*  --  16.4* 16.6*  PLT 205  --  236 230   BNP Recent Labs  Lab 06/25/20 1137  BNP 185.0*    DDimer No results for input(s): DDIMER in the last 168 hours.   Radiology/Studies:  DG Chest 2 View  Result Date: 06/25/2020 CLINICAL DATA:  Shortness of breath.  Wheezing. EXAM: CHEST - 2 VIEW.  Patient is rotated. COMPARISON:  Chest x-ray 05/31/2017 FINDINGS: The heart size and mediastinal contours are unchanged. No definite focal consolidation. Increased interstitial markings. Blunting  of bilateral costophrenic angles. Trace bilateral pleural effusions. No pneumothorax. No acute osseous abnormality. IMPRESSION: Pulmonary edema with trace bilateral pleural effusions. Electronically Signed   By: Tish Frederickson M.D.   On: 06/25/2020 01:37   CT Angio Chest PE W and/or Wo Contrast  Result Date: 06/25/2020 CLINICAL DATA:  Suspected pulmonary embolism. EXAM: CT ANGIOGRAPHY CHEST WITH CONTRAST TECHNIQUE: Multidetector CT imaging of the chest was performed using the standard protocol during bolus administration of intravenous contrast. Multiplanar CT image reconstructions and MIPs were obtained to evaluate the vascular anatomy. CONTRAST:  58mL OMNIPAQUE IOHEXOL 350 MG/ML SOLN COMPARISON:  Chest x-ray from June 25, 2020. FINDINGS: Cardiovascular: Calcified and noncalcified atheromatous plaque of the thoracic aorta. Nonaneurysmal caliber. Aorta is not well opacified. No definite signs of acute aortic process. The heart size is moderately enlarged without pericardial effusion. RIGHT heart enlargement. Reflux of contrast into the hepatic veins during injection. Central pulmonary vasculature with density of 476 Hounsfield units, pulmonary vascular bed is well opacified. Some limited assessment at the distal segmental and subsegmental level at the lung bases, no visible pulmonary embolism Mediastinum/Nodes: No adenopathy in the chest. Mild fullness of RIGHT hilar nodal tissue 1 cm. No thoracic inlet or axillary lymphadenopathy. No gross esophageal abnormality. Lungs/Pleura: Well-circumscribed pulmonary nodule in the RIGHT middle lobe (image 83/6) 6 mm. Septal thickening and small bilateral pleural effusions. Airways are patent. Upper Abdomen: Incidental imaging of upper abdominal contents without acute process. Musculoskeletal: Spinal degenerative changes. No acute bone finding or destructive bone process. Review of  the MIP images confirms the above findings. IMPRESSION: 1. No pulmonary embolism. Mildly  limited assessment of the lung bases due to respiratory motion as described. 2. Signs of pulmonary edema and pleural effusions. Constellation of findings most suggestive of heart failure. 3. Moderate cardiomegaly with signs of right heart enlargement and reflux of contrast into the hepatic veins during injection. This can be seen in the setting of RIGHT heart dysfunction. 4. 6 mm RIGHT middle lobe pulmonary nodule. Non-contrast chest CT at 6-12 months is recommended. If the nodule is stable at time of repeat CT, then future CT at 18-24 months (from today's scan) is considered optional for low-risk patients, but is recommended for high-risk patients. This recommendation follows the consensus statement: Guidelines for Management of Incidental Pulmonary Nodules Detected on CT Images: From the Fleischner Society 2017; Radiology 2017; 284:228-243. 5.  Aortic Atherosclerosis (ICD10-I70.0). Aortic atherosclerosis. Electronically Signed   By: Donzetta Kohut M.D.   On: 06/25/2020 13:44   DG CHEST PORT 1 VIEW  Result Date: 06/27/2020 CLINICAL DATA:  Fever of unknown origin. EXAM: PORTABLE CHEST 1 VIEW COMPARISON:  CT chest and chest radiograph 06/25/2020. FINDINGS: Trachea is midline. Heart is enlarged. Mild interstitial prominence and indistinctness, slightly improved. There may be trace residual left pleural effusion. No dense airspace consolidation. IMPRESSION: Slight improvement in congestive heart failure. Electronically Signed   By: Leanna Battles M.D.   On: 06/27/2020 08:38   ECHOCARDIOGRAM COMPLETE  Result Date: 06/26/2020    ECHOCARDIOGRAM REPORT   Patient Name:   CHANDNI GAGAN Date of Exam: 06/26/2020 Medical Rec #:  [id number]    Height:       63.0 in Accession #:    [id number]   Weight:       114.4 lb Date of Birth:  May 26, 1931    BSA:          1.525 m Patient Age:    88 years     BP:           156/66 mmHg Patient Gender: F            HR:           72 bpm. Exam Location:  Inpatient Procedure: 2D Echo,  Cardiac Doppler and Color Doppler Indications:    Dyspnea R06.00  History:        Patient has no prior history of Echocardiogram examinations.                 CHF; Risk Factors:Hypertension and Diabetes. Dementia.  Sonographer:    Elmarie Shiley Dance Referring Phys: [id number] DAN FLOYD  Sonographer Comments: Image acquisition challenging due to respiratory motion. IMPRESSIONS  1. Left ventricular ejection fraction, by estimation, is 70 to 75%. The left ventricle has hyperdynamic function. The left ventricle has no regional wall motion abnormalities. There is mild left ventricular hypertrophy. Left ventricular diastolic parameters are consistent with Grade I diastolic dysfunction (impaired relaxation). There is the interventricular septum is flattened in systole and diastole, consistent with right ventricular pressure and volume overload.  2. Right ventricular systolic function is mildly reduced. The right ventricular size is normal. There is severely elevated pulmonary artery systolic pressure.  3. The mitral valve is normal in structure. Trivial mitral valve regurgitation. No evidence of mitral stenosis.  4. The aortic valve is tricuspid. Aortic valve regurgitation is not visualized. No aortic stenosis is present.  5. The inferior vena cava is normal in size with greater than 50% respiratory variability, suggesting right atrial pressure of 3  mmHg. FINDINGS  Left Ventricle: Left ventricular ejection fraction, by estimation, is 70 to 75%. The left ventricle has hyperdynamic function. The left ventricle has no regional wall motion abnormalities. The left ventricular internal cavity size was normal in size. There is mild left ventricular hypertrophy. The interventricular septum is flattened in systole and diastole, consistent with right ventricular pressure and volume overload. Left ventricular diastolic parameters are consistent with Grade I diastolic dysfunction (impaired relaxation). Right Ventricle: The right ventricular  size is normal.Right ventricular systolic function is mildly reduced. There is severely elevated pulmonary artery systolic pressure. The tricuspid regurgitant velocity is 4.41 m/s, and with an assumed right atrial pressure of 3 mmHg, the estimated right ventricular systolic pressure is 80.8 mmHg. Left Atrium: Left atrial size was normal in size. Right Atrium: Right atrial size was normal in size. Pericardium: Trivial pericardial effusion is present. Mitral Valve: The mitral valve is normal in structure. Trivial mitral valve regurgitation. No evidence of mitral valve stenosis. Tricuspid Valve: The tricuspid valve is normal in structure. Tricuspid valve regurgitation is mild . No evidence of tricuspid stenosis. Aortic Valve: The aortic valve is tricuspid. Aortic valve regurgitation is not visualized. No aortic stenosis is present. Pulmonic Valve: The pulmonic valve was normal in structure. Pulmonic valve regurgitation is mild. No evidence of pulmonic stenosis. Aorta: The aortic root is normal in size and structure. Venous: The inferior vena cava is normal in size with greater than 50% respiratory variability, suggesting right atrial pressure of 3 mmHg. IAS/Shunts: No atrial level shunt detected by color flow Doppler.  LEFT VENTRICLE PLAX 2D LVIDd:         4.50 cm Diastology LVIDs:         2.50 cm LV e' medial:    7.30 cm/s LV PW:         1.00 cm LV E/e' medial:  6.9 LV IVS:        1.20 cm LV e' lateral:   8.45 cm/s                        LV E/e' lateral: 6.0  RIGHT VENTRICLE             IVC RV Basal diam:  2.60 cm     IVC diam: 2.00 cm RV S prime:     11.60 cm/s TAPSE (M-mode): 1.6 cm LEFT ATRIUM             Index       RIGHT ATRIUM           Index LA diam:        4.20 cm 2.75 cm/m  RA Area:     15.10 cm LA Vol (A2C):   64.2 ml 42.10 ml/m RA Volume:   35.40 ml  23.21 ml/m LA Vol (A4C):   33.9 ml 22.23 ml/m LA Biplane Vol: 50.7 ml 33.24 ml/m  AORTIC VALVE LVOT Vmax:   91.60 cm/s LVOT Vmean:  62.150 cm/s LVOT VTI:     0.186 m  AORTA Ao Asc diam: 2.80 cm MITRAL VALVE               TRICUSPID VALVE MV Area (PHT): 2.29 cm    TR Peak grad:   77.8 mmHg MV Decel Time: 331 msec    TR Vmax:        441.00 cm/s MV E velocity: 50.40 cm/s MV A velocity: 81.00 cm/s  SHUNTS MV E/A ratio:  0.62  Systemic VTI: 0.19 m Olga MillersBrian Crenshaw MD Electronically signed by Olga MillersBrian Crenshaw MD Signature Date/Time: 06/26/2020/2:44:14 PM    Final      Assessment and Plan:   Acute diastolic HF in combination with fever.  EF is elevated with hyperdynamic LV function.  Elevated pressures on echo. Diuresing. She was on lasix at home. Elevated pulmonary pressures. Minimal troponin elevation expected with CHF.would not pursue ischemic eval.   Fever per IM on ABX SR with runs of PAT up to 140 or so. - re-ordered tele to follow-if primary teams wants to discontinue that is fine. No rate slowing meds at home will begin 12.5 BID lopressor Dementia pt is now DNR and Palliative care consulted.  Pt is non verbal. HTN labile this admit  Hx DM but glucose is on lower end.per primary team   Risk Assessment/Risk Scores:        New York Heart Association (NYHA) Functional Class NYHA Class III        For questions or updates, please contact [facility] Please consult [email or web address] for contact info under    Signed, Nada BoozerLaura Nazia Rhines, NP  06/27/2020 3:00 PM

## 2020-06-28 ENCOUNTER — Other Ambulatory Visit (HOSPITAL_COMMUNITY): Payer: Self-pay

## 2020-06-28 ENCOUNTER — Emergency Department (HOSPITAL_COMMUNITY): Payer: Medicare (Managed Care)

## 2020-06-28 ENCOUNTER — Other Ambulatory Visit: Payer: Self-pay

## 2020-06-28 ENCOUNTER — Inpatient Hospital Stay (HOSPITAL_COMMUNITY)
Admission: EM | Admit: 2020-06-28 | Discharge: 2020-07-04 | DRG: 189 | Disposition: A | Discharge status: Skilled Nursing Facility | Payer: Medicare (Managed Care) | Attending: Family Medicine | Admitting: Family Medicine

## 2020-06-28 DIAGNOSIS — E43 Unspecified severe protein-calorie malnutrition: Secondary | ICD-10-CM | POA: Insufficient documentation

## 2020-06-28 DIAGNOSIS — J9601 Acute respiratory failure with hypoxia: Principal | ICD-10-CM | POA: Diagnosis present

## 2020-06-28 DIAGNOSIS — I959 Hypotension, unspecified: Secondary | ICD-10-CM | POA: Diagnosis not present

## 2020-06-28 DIAGNOSIS — I10 Essential (primary) hypertension: Secondary | ICD-10-CM

## 2020-06-28 DIAGNOSIS — I5033 Acute on chronic diastolic (congestive) heart failure: Secondary | ICD-10-CM | POA: Diagnosis present

## 2020-06-28 DIAGNOSIS — R911 Solitary pulmonary nodule: Secondary | ICD-10-CM | POA: Diagnosis present

## 2020-06-28 DIAGNOSIS — J9690 Respiratory failure, unspecified, unspecified whether with hypoxia or hypercapnia: Secondary | ICD-10-CM | POA: Diagnosis present

## 2020-06-28 DIAGNOSIS — I272 Pulmonary hypertension, unspecified: Secondary | ICD-10-CM | POA: Diagnosis present

## 2020-06-28 DIAGNOSIS — B961 Klebsiella pneumoniae [K. pneumoniae] as the cause of diseases classified elsewhere: Secondary | ICD-10-CM | POA: Diagnosis present

## 2020-06-28 DIAGNOSIS — Z751 Person awaiting admission to adequate facility elsewhere: Secondary | ICD-10-CM

## 2020-06-28 DIAGNOSIS — N390 Urinary tract infection, site not specified: Secondary | ICD-10-CM | POA: Diagnosis present

## 2020-06-28 DIAGNOSIS — Z20822 Contact with and (suspected) exposure to covid-19: Secondary | ICD-10-CM | POA: Diagnosis present

## 2020-06-28 DIAGNOSIS — I480 Paroxysmal atrial fibrillation: Secondary | ICD-10-CM | POA: Diagnosis present

## 2020-06-28 DIAGNOSIS — E1169 Type 2 diabetes mellitus with other specified complication: Secondary | ICD-10-CM | POA: Diagnosis present

## 2020-06-28 DIAGNOSIS — I152 Hypertension secondary to endocrine disorders: Secondary | ICD-10-CM | POA: Diagnosis present

## 2020-06-28 DIAGNOSIS — Z681 Body mass index (BMI) 19 or less, adult: Secondary | ICD-10-CM

## 2020-06-28 DIAGNOSIS — I471 Supraventricular tachycardia: Secondary | ICD-10-CM

## 2020-06-28 DIAGNOSIS — Z66 Do not resuscitate: Secondary | ICD-10-CM | POA: Diagnosis present

## 2020-06-28 DIAGNOSIS — F0390 Unspecified dementia without behavioral disturbance: Secondary | ICD-10-CM | POA: Diagnosis present

## 2020-06-28 MED ORDER — CEPHALEXIN 500 MG PO CAPS
500.0000 mg | ORAL_CAPSULE | Freq: Four times a day (QID) | ORAL | 0 refills | Status: DC
  Filled 2020-06-28: qty 24, 6d supply, fill #0

## 2020-06-28 MED ORDER — METOPROLOL TARTRATE 25 MG PO TABS
12.5000 mg | ORAL_TABLET | Freq: Two times a day (BID) | ORAL | 0 refills | Status: DC
  Filled 2020-06-28: qty 30, 30d supply, fill #0

## 2020-06-28 MED ORDER — FUROSEMIDE 10 MG/ML IJ SOLN
40.0000 mg | Freq: Once | INTRAMUSCULAR | Status: AC
  Administered 2020-06-29: 40 mg via INTRAVENOUS
  Filled 2020-06-28: qty 4

## 2020-06-28 MED ORDER — CEPHALEXIN 500 MG PO CAPS
500.0000 mg | ORAL_CAPSULE | Freq: Two times a day (BID) | ORAL | Status: DC
  Administered 2020-06-28: 500 mg via ORAL
  Filled 2020-06-28: qty 1

## 2020-06-28 MED ORDER — FUROSEMIDE 20 MG PO TABS
40.0000 mg | ORAL_TABLET | Freq: Every morning | ORAL | 0 refills | Status: DC
  Filled 2020-06-28: qty 60, 30d supply, fill #0

## 2020-06-28 NOTE — Discharge Instructions (Addendum)
The patient was hospitalized due to increased work of breathing and found to have signs of fluid overload on imaging. An echocardiogram showed that she has heart failure and we removed fluid with diuresing using the medication called Lasix (furosemide), which improved her breathing. Cardiology was consulted and patient's home Lasix dose was increased to 40mg  daily and she was started on a medication called Lopressor 12.5mg  twice daily because while on cardiac monitoring she was found to have some episodes of atrial fibrillation.   While hospitalized, she had a fever and infectious studies including urine and blood cultures were collected. She was found to have a bacteria in her urine that likely caused the fever and she was started on antibiotics, which she will continue after discharge until 6/28.

## 2020-06-28 NOTE — ED Provider Notes (Signed)
[facility] EMERGENCY DEPARTMENT Provider Note   CSN: [id number] Arrival date & time: 06/28/20  2300     History Chief Complaint  Patient presents with   Shortness of Breath    Amanda Stanton is a 87 y.o. female.  Level 5 caveat for dementia.  Patient unable to give a history.  Brought in by EMS with shortness of breath and hypoxia.  Was just discharged in the hospital today after admission for diastolic heart failure and UTI. EMS reports there was a miscommunication with home health service and oxygen was not delivered to the home.  Patient was placed on nonrebreather for O2 sats in the 80s. Unable to verbalize any chest pain. No leg swelling or leg pain.  Discussed with patient's daughter [phone]. Patient's family states patient was discharged prematurely.  They are not aware she was coming home today.  They are not aware that she had a UTI.  They state they called the patient's nurse because patient was having increased difficulty breathing and hypoxia to the 80s.  They were told to call EMS.  The nurse told the patient's family that she was supposed to be sent home on oxygen.   The history is provided by the patient and the EMS personnel.  Shortness of Breath     Past Medical History:  Diagnosis Date   Acute decompensated heart failure (HCC) 06/26/2020   Acute respiratory distress 05/29/2017   CHF (congestive heart failure) (HCC)    Dementia (HCC)    Diabetes mellitus without complication (HCC)    Hypertension    Nonverbal 06/27/2020    Patient Active Problem List   Diagnosis Date Noted   Protein-calorie malnutrition, severe 06/28/2020   Nonverbal 06/27/2020   Pulmonary hypertension (HCC) 06/27/2020   Acute respiratory failure with hypoxemia (HCC) 06/26/2020   Acute on chronic diastolic heart failure (HCC) 06/26/2020   QT prolongation 06/26/2020   Pulmonary nodule, right 06/26/2020   Dyspnea 06/25/2020   Hypertension associated with diabetes (HCC)  05/29/2017   Dementia (HCC) 05/29/2017   DM (diabetes mellitus) (HCC) 05/29/2017   CHF (congestive heart failure) (HCC) 05/29/2017   Fever 05/29/2017    No past surgical history on file.   OB History   No obstetric history on file.     No family history on file.  Social History   Tobacco Use   Smoking status: Never   Smokeless tobacco: Never  Substance Use Topics   Alcohol use: No    Home Medications Prior to Admission medications   Medication Sig Start Date End Date Taking? Authorizing Provider  acetaminophen (TYLENOL) 160 MG/5ML solution Take 480 mg by mouth every 12 (twelve) hours as needed for headache (pain).    [provider]  albuterol (VENTOLIN HFA) 108 (90 Base) MCG/ACT inhaler Inhale 2 puffs into the lungs every 4 (four) hours as needed for wheezing.    [provider]  cephALEXin (KEFLEX) 500 MG capsule Take 1 capsule (500 mg total) by mouth 4 (four) times daily for 6 days. 06/28/20 07/04/20    Cholecalciferol (VITAMIN D3) 1.25 MG (50000 UT) CAPS Take 50,000 Units by mouth every 30 (thirty) days. On or about the 15th of each month    [provider]  citalopram (CELEXA) 20 MG tablet Take 20 mg by mouth at bedtime.    [provider]  furosemide (LASIX) 20 MG tablet Take 2 tablets (40 mg total) by mouth every morning. 06/28/20 07/28/20  Carney Living, MD  guaifenesin (  ROBITUSSIN) 100 MG/5ML syrup Take 200 mg by mouth every 4 (four) hours as needed for cough.    [provider]  LORazepam (ATIVAN) 0.5 MG tablet Take 0.5 mg by mouth at bedtime. For anxiety/insomnia    [provider]  metoprolol tartrate (LOPRESSOR) 25 MG tablet Take 0.5 tablets (12.5 mg total) by mouth 2 (two) times daily. 06/28/20 07/28/20  Lilland, Percival Spanish, DO  vitamin B-12 (CYANOCOBALAMIN) 1000 MCG tablet Take 1,000 mcg by mouth every morning.    [provider]  Zinc Oxide (BALMEX EX) Apply 1 application topically 2 (two) times daily.  Apply to buttocks    [provider]    Allergies    Aricept [donepezil hcl]  Review of Systems   Review of Systems  Unable to perform ROS: Dementia  Respiratory:  Positive for shortness of breath.    Physical Exam Updated Vital Signs BP (!) 145/63   Pulse (!) 56   Temp 98.2 F (36.8 C) (Oral)   Resp (!) 21   SpO2 99%   Physical Exam Vitals and nursing note reviewed.  Constitutional:      General: She is in acute distress.     Appearance: She is well-developed. She is ill-appearing.     Comments: Chronic ill-appearing, demented, unable to give a history Mild increased work of breathing  HENT:     Head: Normocephalic and atraumatic.     Mouth/Throat:     Pharynx: No oropharyngeal exudate.  Eyes:     Conjunctiva/sclera: Conjunctivae normal.     Pupils: Pupils are equal, round, and reactive to light.  Neck:     Comments: No meningismus. Cardiovascular:     Rate and Rhythm: Normal rate and regular rhythm.     Heart sounds: Normal heart sounds. No murmur heard. Pulmonary:     Effort: Respiratory distress present.     Breath sounds: Rales present.  Chest:     Chest wall: No tenderness.  Abdominal:     Palpations: Abdomen is soft.     Tenderness: There is no abdominal tenderness. There is no guarding or rebound.  Musculoskeletal:        General: No tenderness. Normal range of motion.     Cervical back: Normal range of motion and neck supple.     Right lower leg: No edema.     Left lower leg: No edema.  Skin:    General: Skin is warm.  Neurological:     Mental Status: She is alert.     Motor: No abnormal muscle tone.     Comments: Nonverbal, does not follow commands  Psychiatric:        Behavior: Behavior normal.    ED Results / Procedures / Treatments   Labs (all labs ordered are listed, but only abnormal results are displayed) Labs Reviewed  CBC WITH DIFFERENTIAL/PLATELET - Abnormal; Notable for the following components:      Result Value    Hemoglobin 11.7 (*)    RDW 16.6 (*)    All other components within normal limits  COMPREHENSIVE METABOLIC PANEL - Abnormal; Notable for the following components:   Glucose, Bld 122 (*)    Calcium 8.7 (*)    Albumin 3.3 (*)    All other components within normal limits  BRAIN NATRIURETIC PEPTIDE - Abnormal; Notable for the following components:   B Natriuretic Peptide 149.4 (*)    All other components within normal limits  URINALYSIS, ROUTINE W REFLEX MICROSCOPIC - Abnormal; Notable for the following  components:   APPearance CLOUDY (*)    Hgb urine dipstick MODERATE (*)    Leukocytes,Ua LARGE (*)    Bacteria, UA MANY (*)    All other components within normal limits  TROPONIN I (HIGH SENSITIVITY) - Abnormal; Notable for the following components:   Troponin I (High Sensitivity) 19 (*)    All other components within normal limits  RESP PANEL BY RT-PCR (FLU A&B, COVID) ARPGX2  CULTURE, BLOOD (ROUTINE X 2)  CULTURE, BLOOD (ROUTINE X 2)  LACTIC ACID, PLASMA  LACTIC ACID, PLASMA  TROPONIN I (HIGH SENSITIVITY)    EKG EKG Interpretation  Date/Time:  Friday June 29 2020 00:22:49 EDT Ventricular Rate:  54 PR Interval:  177 QRS Duration: 108 QT Interval:  529 QTC Calculation: 502 R Axis:   -66 Text Interpretation: Sinus rhythm Incomplete RBBB and LAFB Consider right ventricular hypertrophy Probable left ventricular hypertrophy Abnrm T, consider ischemia, anterolateral lds Prolonged QT interval Nonspecific T wave abnormality Confirmed by Glynn Octave [phone]) on 06/29/2020 12:36:25 AM  Radiology DG Chest Portable 1 View  Result Date: 06/28/2020 CLINICAL DATA:  Shortness of breath EXAM: PORTABLE CHEST 1 VIEW COMPARISON:  06/27/2020, CT chest 06/25/2020 FINDINGS: Cardiomegaly without significant effusion by radiography. Mild diffuse bilateral ground-glass opacity. No pneumothorax. Aortic atherosclerosis. IMPRESSION: Cardiomegaly with mild central congestion and minimal perihilar  ground-glass opacity probably mild residual edema Electronically Signed   By: Jasmine Pang M.D.   On: 06/28/2020 23:44   DG CHEST PORT 1 VIEW  Result Date: 06/27/2020 CLINICAL DATA:  Fever of unknown origin. EXAM: PORTABLE CHEST 1 VIEW COMPARISON:  CT chest and chest radiograph 06/25/2020. FINDINGS: Trachea is midline. Heart is enlarged. Mild interstitial prominence and indistinctness, slightly improved. There may be trace residual left pleural effusion. No dense airspace consolidation. IMPRESSION: Slight improvement in congestive heart failure. Electronically Signed   By: Leanna Battles M.D.   On: 06/27/2020 08:38    Procedures .Critical Care  Date/Time: 06/29/2020 7:26 AM Performed by: Glynn Octave, MD Authorized by: Glynn Octave, MD   Critical care provider statement:    Critical care time (minutes):  35   Critical care was necessary to treat or prevent imminent or life-threatening deterioration of the following conditions:  Respiratory failure   Critical care was time spent personally by me on the following activities:  Discussions with consultants, evaluation of patient's response to treatment, examination of patient, ordering and performing treatments and interventions, ordering and review of laboratory studies, ordering and review of radiographic studies, pulse oximetry, re-evaluation of patient's condition, obtaining history from patient or surrogate and review of old charts   Medications Ordered in ED Medications - No data to display  ED Course  I have reviewed the triage vital signs and the nursing notes.  Pertinent labs & imaging results that were available during my care of the patient were reviewed by me and considered in my medical decision making (see chart for details).    MDM Rules/Calculators/A&P                         Patient returns with difficulty breathing after discharge today for diastolic heart failure.  She was hypoxic at home into the mid 80s.  Placed  on nasal cannula oxygenation by EMS with improvement.  Unable to give any history.  EKG shows worsening T wave inversions diffusely. Chest x-ray concerning for CHF pattern. IV Lasix given.  No documentation during hospitalization that she was supposed to be sent  home on home oxygen  Suspect worsening CHF and hypoxia secondary to same.  IV Lasix given.  Wean oxygen at home and needing further diuresis we will plan for admission.  Discussed with family practice residents  Labs appear to be at baseline.  Patient weaned to 2 L nasal cannula.  Urinalysis concerning for infection though patient is currently being treated for UTI.  Given need for oxygen and unclear oxygen requirements, readmission discussed with family practice residents Final Clinical Impression(s) / ED Diagnoses Final diagnoses:  Acute respiratory failure with hypoxia [facility](HCC)    Rx / DC Orders ED Discharge Orders     None        Quinlan Mcfall, Jeannett SeniorStephen, MD 06/29/20 [phone]

## 2020-06-28 NOTE — Progress Notes (Signed)
Progress Note  Patient Name: Amanda Stanton Date of Encounter: 06/28/2020  [facility] Cardiologist: None   Subjective   Patient remains nonverbal and cannot communicate  Inpatient Medications    Scheduled Meds:  cephALEXin  500 mg Oral Q12H   citalopram  20 mg Oral QHS   enoxaparin (LOVENOX) injection  40 mg Subcutaneous Q24H   furosemide  40 mg Oral Daily   LORazepam  0.5 mg Oral QHS   metoprolol tartrate  12.5 mg Oral BID   multivitamin with minerals  1 tablet Oral Daily   vitamin B-12  1,000 mcg Oral q morning   Continuous Infusions:  PRN Meds: acetaminophen **OR** acetaminophen   Vital Signs    Vitals:   06/27/20 1410 06/27/20 1954 06/28/20 0500 06/28/20 0509  BP: (!) 147/82 139/78  124/75  Pulse: 72 71  69  Resp: 17 18  18   Temp: 99.2 F (37.3 C) 98.9 F (37.2 C)  98.1 F (36.7 C)  TempSrc:  Axillary  Axillary  SpO2: 94% 96%  95%  Weight:   50.9 kg   Height:        Intake/Output Summary (Last 24 hours) at 06/28/2020 1113 Last data filed at 06/28/2020 06/30/2020 Gross per 24 hour  Intake 420 ml  Output 300 ml  Net 120 ml   Last 3 Weights 06/28/2020 06/26/2020 06/25/2020  Weight (lbs) 112 lb 3.4 oz 114 lb 6.7 oz 115 lb 4.8 oz  Weight (kg) 50.9 kg 51.9 kg 52.3 kg      Telemetry    NSR - Personally Reviewed  ECG    No new EKG to review - Personally Reviewed  Physical Exam   GEN: thin frail and ill appearing Neck: No JVD Cardiac: RRR, no murmurs, rubs, or gallops.  Respiratory: Clear to auscultation bilaterally. GI: Soft, nontender, non-distended  MS: No edema; No deformity. Neuro:  cannot assess Psych: cannot assess  Labs    High Sensitivity Troponin:   Recent Labs  Lab 06/25/20 1137 06/25/20 1329 06/25/20 1619  TROPONINIHS 24* 19* 27*      Chemistry Recent Labs  Lab 06/25/20 1137 06/25/20 1146 06/26/20 0005 06/27/20 0611  NA 141 145 140 139  K 3.6 3.6 3.5 3.7  CL 109 107 104 105  CO2 26  --  25 26  GLUCOSE 94 93 108* 102*   BUN 7* 8 7* 8  CREATININE 0.58 0.60 0.67 0.61  CALCIUM 8.2*  --  8.7* 8.9  GFRNONAA >60  --  >60 >60  ANIONGAP 6  --  11 8     Hematology Recent Labs  Lab 06/25/20 1137 06/25/20 1146 06/26/20 0005 06/27/20 0611  WBC 5.5  --  6.7 4.4  RBC 4.04  --  4.30 4.81  HGB 10.6* 11.6* 11.3* 12.7  HCT 35.3* 34.0* 35.9* 40.8  MCV 87.4  --  83.5 84.8  MCH 26.2  --  26.3 26.4  MCHC 30.0  --  31.5 31.1  RDW 16.5*  --  16.4* 16.6*  PLT 205  --  236 230    BNP Recent Labs  Lab 06/25/20 1137  BNP 185.0*     DDimer No results for input(s): DDIMER in the last 168 hours.   CHA2DS2-VASc Score = 6  his indicates a 9.7% annual risk of stroke. The patient's score is based upon: CHF History: Yes HTN History: Yes Diabetes History: Yes Stroke History: No Vascular Disease History: No Age Score: 2 Gender Score: 1   Radiology  DG CHEST PORT 1 VIEW  Result Date: 06/27/2020 CLINICAL DATA:  Fever of unknown origin. EXAM: PORTABLE CHEST 1 VIEW COMPARISON:  CT chest and chest radiograph 06/25/2020. FINDINGS: Trachea is midline. Heart is enlarged. Mild interstitial prominence and indistinctness, slightly improved. There may be trace residual left pleural effusion. No dense airspace consolidation. IMPRESSION: Slight improvement in congestive heart failure. Electronically Signed   By: Leanna Battles M.D.   On: 06/27/2020 08:38   ECHOCARDIOGRAM COMPLETE  Result Date: 06/26/2020    ECHOCARDIOGRAM REPORT   Patient Name:   [name] Date of Exam: 06/26/2020 Medical Rec #:  [id number]    Height:       63.0 in Accession #:    [id number]   Weight:       114.4 lb Date of Birth:  [birth year]    BSA:          1.525 m Patient Age:    88 years     BP:           156/66 mmHg Patient Gender: F            HR:           72 bpm. Exam Location:  Inpatient Procedure: 2D Echo, Cardiac Doppler and Color Doppler Indications:    Dyspnea R06.00  History:        Patient has no prior history of Echocardiogram examinations.                  CHF; Risk Factors:Hypertension and Diabetes. Dementia.  Sonographer:    Elmarie Shiley Dance Referring Phys: [id number] DAN FLOYD  Sonographer Comments: Image acquisition challenging due to respiratory motion. IMPRESSIONS  1. Left ventricular ejection fraction, by estimation, is 70 to 75%. The left ventricle has hyperdynamic function. The left ventricle has no regional wall motion abnormalities. There is mild left ventricular hypertrophy. Left ventricular diastolic parameters are consistent with Grade I diastolic dysfunction (impaired relaxation). There is the interventricular septum is flattened in systole and diastole, consistent with right ventricular pressure and volume overload.  2. Right ventricular systolic function is mildly reduced. The right ventricular size is normal. There is severely elevated pulmonary artery systolic pressure.  3. The mitral valve is normal in structure. Trivial mitral valve regurgitation. No evidence of mitral stenosis.  4. The aortic valve is tricuspid. Aortic valve regurgitation is not visualized. No aortic stenosis is present.  5. The inferior vena cava is normal in size with greater than 50% respiratory variability, suggesting right atrial pressure of 3 mmHg. FINDINGS  Left Ventricle: Left ventricular ejection fraction, by estimation, is 70 to 75%. The left ventricle has hyperdynamic function. The left ventricle has no regional wall motion abnormalities. The left ventricular internal cavity size was normal in size. There is mild left ventricular hypertrophy. The interventricular septum is flattened in systole and diastole, consistent with right ventricular pressure and volume overload. Left ventricular diastolic parameters are consistent with Grade I diastolic dysfunction (impaired relaxation). Right Ventricle: The right ventricular size is normal.Right ventricular systolic function is mildly reduced. There is severely elevated pulmonary artery systolic pressure. The  tricuspid regurgitant velocity is 4.41 m/s, and with an assumed right atrial pressure of 3 mmHg, the estimated right ventricular systolic pressure is 80.8 mmHg. Left Atrium: Left atrial size was normal in size. Right Atrium: Right atrial size was normal in size. Pericardium: Trivial pericardial effusion is present. Mitral Valve: The mitral valve is normal in structure. Trivial mitral valve regurgitation. No evidence of mitral valve  stenosis. Tricuspid Valve: The tricuspid valve is normal in structure. Tricuspid valve regurgitation is mild . No evidence of tricuspid stenosis. Aortic Valve: The aortic valve is tricuspid. Aortic valve regurgitation is not visualized. No aortic stenosis is present. Pulmonic Valve: The pulmonic valve was normal in structure. Pulmonic valve regurgitation is mild. No evidence of pulmonic stenosis. Aorta: The aortic root is normal in size and structure. Venous: The inferior vena cava is normal in size with greater than 50% respiratory variability, suggesting right atrial pressure of 3 mmHg. IAS/Shunts: No atrial level shunt detected by color flow Doppler.  LEFT VENTRICLE PLAX 2D LVIDd:         4.50 cm Diastology LVIDs:         2.50 cm LV e' medial:    7.30 cm/s LV PW:         1.00 cm LV E/e' medial:  6.9 LV IVS:        1.20 cm LV e' lateral:   8.45 cm/s                        LV E/e' lateral: 6.0  RIGHT VENTRICLE             IVC RV Basal diam:  2.60 cm     IVC diam: 2.00 cm RV S prime:     11.60 cm/s TAPSE (M-mode): 1.6 cm LEFT ATRIUM             Index       RIGHT ATRIUM           Index LA diam:        4.20 cm 2.75 cm/m  RA Area:     15.10 cm LA Vol (A2C):   64.2 ml 42.10 ml/m RA Volume:   35.40 ml  23.21 ml/m LA Vol (A4C):   33.9 ml 22.23 ml/m LA Biplane Vol: 50.7 ml 33.24 ml/m  AORTIC VALVE LVOT Vmax:   91.60 cm/s LVOT Vmean:  62.150 cm/s LVOT VTI:    0.186 m  AORTA Ao Asc diam: 2.80 cm MITRAL VALVE               TRICUSPID VALVE MV Area (PHT): 2.29 cm    TR Peak grad:   77.8 mmHg  MV Decel Time: 331 msec    TR Vmax:        441.00 cm/s MV E velocity: 50.40 cm/s MV A velocity: 81.00 cm/s  SHUNTS MV E/A ratio:  0.62        Systemic VTI: 0.19 m Olga Millers MD Electronically signed by Olga Millers MD Signature Date/Time: 06/26/2020/2:44:14 PM    Final     Cardiac Studies   2D echo 06/2020 IMPRESSIONS    1. Left ventricular ejection fraction, by estimation, is 70 to 75%. The  left ventricle has hyperdynamic function. The left ventricle has no  regional wall motion abnormalities. There is mild left ventricular  hypertrophy. Left ventricular diastolic  parameters are consistent with Grade I diastolic dysfunction (impaired  relaxation). There is the interventricular septum is flattened in systole  and diastole, consistent with right ventricular pressure and volume  overload.   2. Right ventricular systolic function is mildly reduced. The right  ventricular size is normal. There is severely elevated pulmonary artery  systolic pressure.   3. The mitral valve is normal in structure. Trivial mitral valve  regurgitation. No evidence of mitral stenosis.   4. The aortic valve is tricuspid. Aortic valve regurgitation  is not  visualized. No aortic stenosis is present.   5. The inferior vena cava is normal in size with greater than 50%  respiratory variability, suggesting right atrial pressure of 3 mmHg.   Patient Profile     87 y.o. female with a hx of HTN, dementia, CHF in 2019 who is being seen 06/27/2020 for the evaluation of CHF at the request of Dr McDiarmid.  Assessment & Plan    Acute diastolic HF  -likely related to possible underlying infection with fever and UTI -2D echo showed hyperdynamic LV function with severe PHTN -she has been on Lasix IV and diuresed well and is net neg 3.4L>>now on PO Lasix 40mg  daily -SCr stable at 0.61 and K+ 3.7  Elevated Troponin -Minimal troponin elevation expected with CHF (hsTrop 19>27) -2D echo with hyperdynamic LVF -given  advanced age, severe dementia, DNR and nonverbal and normal LVF would not pursue further ischemic workup   Fever  -per IM on ABX  Paroxysmal atrial tachcyardia  -up to 140bpm on tele yesterday -started on Lopressor 12.5mg  BID yesterday with no further arrhythmias  Dementia  -pt is now DNR and Palliative care consulted.   -Pt is non verbal.  HTN  -BP controlled at 124/3875mmHg -continue low dose Lopressor  [facility] will sign off.   Medication Recommendations:  Lopressor 12.5mg  BID and Lasix 40mg  daily Other recommendations (labs, testing, etc):  none Follow up as an outpatient:  Followup with PCP       For questions or updates, please contact [facility] Please consult [email or web address] for contact info under        Signed, Armanda Magicraci Deshawn Witty, MD  06/28/2020, 11:13 AM

## 2020-06-28 NOTE — Plan of Care (Signed)
  Problem: Education: Goal: Knowledge of General Education information will improve Description Including pain rating scale, medication(s)/side effects and non-pharmacologic comfort measures Outcome: Progressing   Problem: Health Behavior/Discharge Planning: Goal: Ability to manage health-related needs will improve Outcome: Progressing   Problem: Clinical Measurements: Goal: Ability to maintain clinical measurements within normal limits will improve Outcome: Progressing Goal: Will remain free from infection Outcome: Progressing Goal: Diagnostic test results will improve Outcome: Progressing Goal: Respiratory complications will improve Outcome: Progressing Goal: Cardiovascular complication will be avoided Outcome: Progressing   Problem: Activity: Goal: Risk for activity intolerance will decrease Outcome: Progressing   Problem: Nutrition: Goal: Adequate nutrition will be maintained Outcome: Progressing   Problem: Coping: Goal: Level of anxiety will decrease Outcome: Progressing   Problem: Elimination: Goal: Will not experience complications related to bowel motility Outcome: Progressing Goal: Will not experience complications related to urinary retention Outcome: Progressing   Problem: Pain Managment: Goal: General experience of comfort will improve Outcome: Progressing   Problem: Safety: Goal: Ability to remain free from injury will improve Outcome: Progressing   Problem: Skin Integrity: Goal: Risk for impaired skin integrity will decrease Outcome: Progressing   Problem: Activity: Goal: Capacity to carry out activities will improve Outcome: Progressing   Problem: Cardiac: Goal: Ability to achieve and maintain adequate cardiopulmonary perfusion will improve Outcome: Progressing   

## 2020-06-28 NOTE — Plan of Care (Signed)
Problem: Education: Goal: Knowledge of General Education information will improve Description: Including pain rating scale, medication(s)/side effects and non-pharmacologic comfort measures 06/28/2020 1354 by Durward Fortes, RN Outcome: Adequate for Discharge 06/28/2020 0805 by Durward Fortes, RN Outcome: Progressing 06/28/2020 0730 by Durward Fortes, RN Outcome: Progressing   Problem: Health Behavior/Discharge Planning: Goal: Ability to manage health-related needs will improve 06/28/2020 1354 by Durward Fortes, RN Outcome: Adequate for Discharge 06/28/2020 0805 by Durward Fortes, RN Outcome: Progressing 06/28/2020 0730 by Durward Fortes, RN Outcome: Progressing   Problem: Clinical Measurements: Goal: Ability to maintain clinical measurements within normal limits will improve 06/28/2020 1354 by Durward Fortes, RN Outcome: Adequate for Discharge 06/28/2020 0805 by Durward Fortes, RN Outcome: Progressing 06/28/2020 0730 by Durward Fortes, RN Outcome: Progressing Goal: Will remain free from infection 06/28/2020 1354 by Durward Fortes, RN Outcome: Adequate for Discharge 06/28/2020 0805 by Durward Fortes, RN Outcome: Progressing 06/28/2020 0730 by Durward Fortes, RN Outcome: Progressing Goal: Diagnostic test results will improve 06/28/2020 1354 by Durward Fortes, RN Outcome: Adequate for Discharge 06/28/2020 0805 by Durward Fortes, RN Outcome: Progressing 06/28/2020 0730 by Durward Fortes, RN Outcome: Progressing Goal: Respiratory complications will improve 06/28/2020 1354 by Durward Fortes, RN Outcome: Adequate for Discharge 06/28/2020 0805 by Durward Fortes, RN Outcome: Progressing 06/28/2020 0730 by Durward Fortes, RN Outcome: Progressing Goal: Cardiovascular complication will be avoided 06/28/2020 1354 by Durward Fortes, RN Outcome: Adequate for Discharge 06/28/2020 0805 by Durward Fortes, RN Outcome: Progressing 06/28/2020 0730 by Durward Fortes, RN Outcome: Progressing    Problem: Activity: Goal: Risk for activity intolerance will decrease 06/28/2020 1354 by Durward Fortes, RN Outcome: Adequate for Discharge 06/28/2020 0805 by Durward Fortes, RN Outcome: Progressing 06/28/2020 0730 by Durward Fortes, RN Outcome: Progressing   Problem: Nutrition: Goal: Adequate nutrition will be maintained 06/28/2020 1354 by Durward Fortes, RN Outcome: Adequate for Discharge 06/28/2020 0805 by Durward Fortes, RN Outcome: Progressing 06/28/2020 0730 by Durward Fortes, RN Outcome: Progressing   Problem: Coping: Goal: Level of anxiety will decrease 06/28/2020 1354 by Durward Fortes, RN Outcome: Adequate for Discharge 06/28/2020 0805 by Durward Fortes, RN Outcome: Progressing 06/28/2020 0730 by Durward Fortes, RN Outcome: Progressing   Problem: Elimination: Goal: Will not experience complications related to bowel motility 06/28/2020 1354 by Durward Fortes, RN Outcome: Adequate for Discharge 06/28/2020 0805 by Durward Fortes, RN Outcome: Progressing 06/28/2020 0730 by Durward Fortes, RN Outcome: Progressing Goal: Will not experience complications related to urinary retention 06/28/2020 1354 by Durward Fortes, RN Outcome: Adequate for Discharge 06/28/2020 0805 by Durward Fortes, RN Outcome: Progressing 06/28/2020 0730 by Durward Fortes, RN Outcome: Progressing   Problem: Pain Managment: Goal: General experience of comfort will improve 06/28/2020 1354 by Durward Fortes, RN Outcome: Adequate for Discharge 06/28/2020 0805 by Durward Fortes, RN Outcome: Progressing 06/28/2020 0730 by Durward Fortes, RN Outcome: Progressing   Problem: Safety: Goal: Ability to remain free from injury will improve 06/28/2020 1354 by Durward Fortes, RN Outcome: Adequate for Discharge 06/28/2020 0805 by Durward Fortes, RN Outcome: Progressing 06/28/2020 0730 by Durward Fortes, RN Outcome: Progressing   Problem: Skin Integrity: Goal: Risk for impaired skin integrity will decrease 06/28/2020  1354 by Durward Fortes, RN Outcome: Adequate for Discharge 06/28/2020 0805 by Durward Fortes, RN Outcome: Progressing 06/28/2020 0730 by Durward Fortes, RN  Outcome: Progressing   Problem: Activity: Goal: Capacity to carry out activities will improve 06/28/2020 1354 by Durward Fortes, RN Outcome: Adequate for Discharge 06/28/2020 0805 by Durward Fortes, RN Outcome: Progressing 06/28/2020 0730 by Durward Fortes, RN Outcome: Progressing   Problem: Cardiac: Goal: Ability to achieve and maintain adequate cardiopulmonary perfusion will improve 06/28/2020 1354 by Durward Fortes, RN Outcome: Adequate for Discharge 06/28/2020 0805 by Durward Fortes, RN Outcome: Progressing 06/28/2020 0730 by Durward Fortes, RN Outcome: Progressing

## 2020-06-28 NOTE — Progress Notes (Addendum)
Family Medicine Teaching Service Daily Progress Note Intern Pager: [phone]  Patient name: Amanda Stanton Medical record number: [id number] Date of birth: 13-Apr-1931 Age: 87 y.o. Gender: female  Primary Care Provider: Jethro Bastos, MD Consultants: Cardiology Code Status: DNR  Pt Overview and Major Events to Date:  6/20-admitted   Assessment and Plan: Amanda Stanton is a 87 y.o. female presenting with labored breathing found to have fluid overload.  PMH significant for dementia and hypertension.  Acute diastolic HF UOP in the last 24h , weight currently 112.3lbs (baseline appears to be around 113lbs-118lbs at [facility] program).  - Cardiology consulted, appreciate recs - Will contact daughter to monitor if at baseline respiratory status - Lasix 40mg  oral daily - daily weights - Strict Is&Os  Paroxysmal atrial fibrillation In NSR during examination. - Cardiology following - Continuous cardiac telemetry - Lopressors 12.5mg  BID was started by cardiology  UTI with fever Patient remained afebrile overnight with Tmax of 99.19F. UA showed leukocytes and bacteria. Urine culture with >100,000 CFU of gram negative rods, will step-down therapy to Keflex and complete a total of 5 antibiotic days - Start Keflex 500mg  QID  - Follow-up urine culture  HTN BP remains labile, currently normotensive with prior hypertensive readings. Will continue to monitor without adding oral antihypertensive at this time.  - Continue to monitor BP  Severe malnutrition in setting of chronic illness In the setting of chronic disease with CHF and dementia, patient is nonverbal and difficult to communicate needs with staff.  - Nutrition consulted, appreciate recs    FEN/GI: Dysphagia 2 diet PPx: Lovenox Dispo:Home pending clinical improvement . Barriers include awaiting culture results.   Subjective:  Patient is non-verbal, no concerns overnight from team.   Objective: Temp:  [98.1 F (36.7 C)-99.2 F  (37.3 C)] 98.1 F (36.7 C) (06/23 0509) Pulse Rate:  [69-72] 69 (06/23 0509) Resp:  [17-18] 18 (06/23 0509) BP: (124-147)/(75-82) 124/75 (06/23 0509) SpO2:  [94 %-96 %] 95 % (06/23 0509) Weight:  [50.9 kg] 50.9 kg (06/23 0500) Physical Exam: General: NAD, sleeping comfortably Cardiovascular: RRR, systolic murmur appreciated Respiratory: comfortable on room air, no increased WOB, sleeping comfortably, unable to auscultate deep inspirations Abdomen: non-distended, soft Extremities: sleeping comfortably, spontaneously moving all extremities  Laboratory: Recent Labs  Lab 06/25/20 1137 06/25/20 1146 06/26/20 0005 06/27/20 0611  WBC 5.5  --  6.7 4.4  HGB 10.6* 11.6* 11.3* 12.7  HCT 35.3* 34.0* 35.9* 40.8  PLT 205  --  236 230   Recent Labs  Lab 06/25/20 1137 06/25/20 1146 06/26/20 0005 06/27/20 0611  NA 141 145 140 139  K 3.6 3.6 3.5 3.7  CL 109 107 104 105  CO2 26  --  25 26  BUN 7* 8 7* 8  CREATININE 0.58 0.60 0.67 0.61  CALCIUM 8.2*  --  8.7* 8.9  GLUCOSE 94 93 108* 102*     Imaging/Diagnostic Tests: No results found.   06/28/20, DO 06/28/2020, 8:23 AM PGY-1, [facility] Family Medicine FPTS Intern pager: [phone], text pages welcome

## 2020-06-28 NOTE — Discharge Summary (Signed)
Family Medicine Teaching [facility] Discharge Summary  Patient name: Amanda Stanton Medical record number: [id number] Date of birth: 03-31-1931 Age: 87 y.o. Gender: female Date of Admission: 06/25/2020  Date of Discharge: 06/28/2020 Admitting Physician: Leighton Roach McDiarmid, MD  Primary Care Provider: Jethro Bastos, MD Consultants: Cardiology  Indication for Hospitalization: Dyspnea  Discharge Diagnoses/Problem List:  Patient Active Problem List   Diagnosis Date Noted   Protein-calorie malnutrition, severe 06/28/2020   Nonverbal 06/27/2020   Pulmonary hypertension (HCC) 06/27/2020   Acute respiratory failure with hypoxemia (HCC) 06/26/2020   Acute on chronic diastolic heart failure (HCC) 06/26/2020   QT prolongation 06/26/2020   Pulmonary nodule, right 06/26/2020   Dyspnea 06/25/2020   Hypertension associated with diabetes (HCC) 05/29/2017   Dementia (HCC) 05/29/2017   DM (diabetes mellitus) (HCC) 05/29/2017   CHF (congestive heart failure) (HCC) 05/29/2017   Fever 05/29/2017    Disposition: Home  Discharge Condition: Stable, improved  Discharge Exam:  General: NAD, sleeping comfortably Cardiovascular: RRR, systolic murmur appreciated Respiratory: comfortable on room air, no increased WOB, sleeping comfortably, unable to auscultate deep inspirations Abdomen: non-distended, soft Extremities: sleeping comfortably, spontaneously moving all extremities  Brief Hospital Course:  [name] is a 87 y.o. female who presented with labored breathing for 3 days with PMH significant for dementia, HTN.   Increased WOB 2/2 HF Exacerbation  Patient with a possible history of CHF on home Lasix presented with labored breathing x3 days.  In the ED, patient was tachypneic and hypoxic to 76% and started on supplemental oxygen at 3L [place]. Labs on admission were notable for elevated lactic acid of 2.4>2.3, BNP of 185, troponin 24>19.  CXR showed pulmonary edema with trace bilateral pleural  effusions. CT chest was negative for PE but notable for a nodule as described below.  Echocardiogram showed LVEF 70-75% with hyperdynamic function of LV, mild LVH, grade 1 diastolic dysfunction, flattening of interventricular septum consistent with right ventricular pressure and volume overload, severely elevated pulmonary artery systolic pressure.  Patient was diuresed with IV lasix during hospitalization with significant improvement and transitioned to oral Lasix  prior to discharge.  Paroxysmal atrial fibrillation Patient in NSR during this examination wounds with some runs of paroxysmal atrial fibrillation on telemetry.  Cardiology was consulted previously for patient's heart failure but also recommended starting Lopressor 12.5 mg twice daily, which is being continued at discharge.  Concern for pyelonephritis  Patient fevered to 101F during hospitalization with no known cause during that time. Blood and urine cultures were collected. Repeat CXR showed improvement in CHF without consolidation. Urinalysis showed leukocytes and bacteria, culture grew >100,000 CFU of Klebsiella oxytoca. Patient was started on ceftriaxone and received 1 dose before transitioning to oral keflex, which shall be continued to finish a total of 7 antibiotic days.   HTN  Patient was hypertensive persistently during hospitalization with systolic typically ranging between 140s-180s. Patient was not on antihypertensive agents prior to admission.  Patient was not started on thing in the hospital with recommendation for close follow-up outpatient as [facility].  Pulmonary nodule on CT scan CT chest showed 6mm right middle lobe nodule with recommendation for repeat CT at 6-12 months following discharge.    Issues for follow up:  Patient with febrile UTI, will be continued on Keflex 500 mg twice daily for total of 7 days (course to finish on 6/28) Right pulmonary nodule will need repeat chest CT in 6-12  months.  Consider Marcelline Deist for CHF.  BP was elevated during hospitalization, consideration to monitor and add oral antihypertensive as appropriate. Monitor heart rate, patient was started on lopressor 12.5mg  BID by cardiology for paroxysmal atrial fibrillation.   Significant Procedures: None  Significant Labs and Imaging:  Recent Labs  Lab 06/25/20 1137 06/25/20 1146 06/26/20 0005 06/27/20 0611  WBC 5.5  --  6.7 4.4  HGB 10.6* 11.6* 11.3* 12.7  HCT 35.3* 34.0* 35.9* 40.8  PLT 205  --  236 230   Recent Labs  Lab 06/25/20 1137 06/25/20 1146 06/26/20 0005 06/27/20 0611  NA 141 145 140 139  K 3.6 3.6 3.5 3.7  CL 109 107 104 105  CO2 26  --  25 26  GLUCOSE 94 93 108* 102*  BUN 7* 8 7* 8  CREATININE 0.58 0.60 0.67 0.61  CALCIUM 8.2*  --  8.7* 8.9    DG Chest 2 View  Result Date: 06/25/2020 CLINICAL DATA:  Shortness of breath.  Wheezing. EXAM: CHEST - 2 VIEW.  Patient is rotated. COMPARISON:  Chest x-ray 05/31/2017 FINDINGS: The heart size and mediastinal contours are unchanged. No definite focal consolidation. Increased interstitial markings. Blunting of bilateral costophrenic angles. Trace bilateral pleural effusions. No pneumothorax. No acute osseous abnormality. IMPRESSION: Pulmonary edema with trace bilateral pleural effusions. Electronically Signed   By: Tish FredericksonMorgane  Naveau M.D.   On: 06/25/2020 01:37   CT Angio Chest PE W and/or Wo Contrast  Result Date: 06/25/2020 CLINICAL DATA:  Suspected pulmonary embolism. EXAM: CT ANGIOGRAPHY CHEST WITH CONTRAST TECHNIQUE: Multidetector CT imaging of the chest was performed using the standard protocol during bolus administration of intravenous contrast. Multiplanar CT image reconstructions and MIPs were obtained to evaluate the vascular anatomy. CONTRAST:  50mL OMNIPAQUE IOHEXOL 350 MG/ML SOLN COMPARISON:  Chest x-ray from June 25, 2020. FINDINGS: Cardiovascular: Calcified and noncalcified atheromatous plaque of the thoracic aorta.  Nonaneurysmal caliber. Aorta is not well opacified. No definite signs of acute aortic process. The heart size is moderately enlarged without pericardial effusion. RIGHT heart enlargement. Reflux of contrast into the hepatic veins during injection. Central pulmonary vasculature with density of 476 Hounsfield units, pulmonary vascular bed is well opacified. Some limited assessment at the distal segmental and subsegmental level at the lung bases, no visible pulmonary embolism Mediastinum/Nodes: No adenopathy in the chest. Mild fullness of RIGHT hilar nodal tissue 1 cm. No thoracic inlet or axillary lymphadenopathy. No gross esophageal abnormality. Lungs/Pleura: Well-circumscribed pulmonary nodule in the RIGHT middle lobe (image 83/6) 6 mm. Septal thickening and small bilateral pleural effusions. Airways are patent. Upper Abdomen: Incidental imaging of upper abdominal contents without acute process. Musculoskeletal: Spinal degenerative changes. No acute bone finding or destructive bone process. Review of the MIP images confirms the above findings. IMPRESSION: 1. No pulmonary embolism. Mildly limited assessment of the lung bases due to respiratory motion as described. 2. Signs of pulmonary edema and pleural effusions. Constellation of findings most suggestive of heart failure. 3. Moderate cardiomegaly with signs of right heart enlargement and reflux of contrast into the hepatic veins during injection. This can be seen in the setting of RIGHT heart dysfunction. 4. 6 mm RIGHT middle lobe pulmonary nodule. Non-contrast chest CT at 6-12 months is recommended. If the nodule is stable at time of repeat CT, then future CT at 18-24 months (from today's scan) is considered optional for low-risk patients, but is recommended for high-risk patients. This recommendation follows the consensus statement: Guidelines for Management of Incidental Pulmonary Nodules Detected on CT Images: From the Fleischner Society 2017;  Radiology 2017;  [id number]. 5.  Aortic Atherosclerosis (ICD10-I70.0). Aortic atherosclerosis. Electronically Signed   By: Donzetta Kohut M.D.   On: 06/25/2020 13:44   ECHOCARDIOGRAM COMPLETE  Result Date: 06/26/2020    ECHOCARDIOGRAM REPORT   Patient Name:   MERA GUNKEL Date of Exam: 06/26/2020 Medical Rec #:  [id number]    Height:       63.0 in Accession #:    [id number]   Weight:       114.4 lb Date of Birth:  05-08-1931    BSA:          1.525 m Patient Age:    88 years     BP:           156/66 mmHg Patient Gender: F            HR:           72 bpm. Exam Location:  Inpatient Procedure: 2D Echo, Cardiac Doppler and Color Doppler Indications:    Dyspnea R06.00  History:        Patient has no prior history of Echocardiogram examinations.                 CHF; Risk Factors:Hypertension and Diabetes. Dementia.  Sonographer:    Elmarie Shiley Dance Referring Phys: [id number] DAN FLOYD  Sonographer Comments: Image acquisition challenging due to respiratory motion. IMPRESSIONS  1. Left ventricular ejection fraction, by estimation, is 70 to 75%. The left ventricle has hyperdynamic function. The left ventricle has no regional wall motion abnormalities. There is mild left ventricular hypertrophy. Left ventricular diastolic parameters are consistent with Grade I diastolic dysfunction (impaired relaxation). There is the interventricular septum is flattened in systole and diastole, consistent with right ventricular pressure and volume overload.  2. Right ventricular systolic function is mildly reduced. The right ventricular size is normal. There is severely elevated pulmonary artery systolic pressure.  3. The mitral valve is normal in structure. Trivial mitral valve regurgitation. No evidence of mitral stenosis.  4. The aortic valve is tricuspid. Aortic valve regurgitation is not visualized. No aortic stenosis is present.  5. The inferior vena cava is normal in size with greater than 50% respiratory variability, suggesting right atrial pressure of 3  mmHg. FINDINGS  Left Ventricle: Left ventricular ejection fraction, by estimation, is 70 to 75%. The left ventricle has hyperdynamic function. The left ventricle has no regional wall motion abnormalities. The left ventricular internal cavity size was normal in size. There is mild left ventricular hypertrophy. The interventricular septum is flattened in systole and diastole, consistent with right ventricular pressure and volume overload. Left ventricular diastolic parameters are consistent with Grade I diastolic dysfunction (impaired relaxation). Right Ventricle: The right ventricular size is normal.Right ventricular systolic function is mildly reduced. There is severely elevated pulmonary artery systolic pressure. The tricuspid regurgitant velocity is 4.41 m/s, and with an assumed right atrial pressure of 3 mmHg, the estimated right ventricular systolic pressure is 80.8 mmHg. Left Atrium: Left atrial size was normal in size. Right Atrium: Right atrial size was normal in size. Pericardium: Trivial pericardial effusion is present. Mitral Valve: The mitral valve is normal in structure. Trivial mitral valve regurgitation. No evidence of mitral valve stenosis. Tricuspid Valve: The tricuspid valve is normal in structure. Tricuspid valve regurgitation is mild . No evidence of tricuspid stenosis. Aortic Valve: The aortic valve is tricuspid. Aortic valve regurgitation is not visualized. No aortic stenosis is present. Pulmonic Valve: The pulmonic valve was normal in structure. Pulmonic valve regurgitation is  mild. No evidence of pulmonic stenosis. Aorta: The aortic root is normal in size and structure. Venous: The inferior vena cava is normal in size with greater than 50% respiratory variability, suggesting right atrial pressure of 3 mmHg. IAS/Shunts: No atrial level shunt detected by color flow Doppler.  LEFT VENTRICLE PLAX 2D LVIDd:         4.50 cm Diastology LVIDs:         2.50 cm LV e' medial:    7.30 cm/s LV PW:          1.00 cm LV E/e' medial:  6.9 LV IVS:        1.20 cm LV e' lateral:   8.45 cm/s                        LV E/e' lateral: 6.0  RIGHT VENTRICLE             IVC RV Basal diam:  2.60 cm     IVC diam: 2.00 cm RV S prime:     11.60 cm/s TAPSE (M-mode): 1.6 cm LEFT ATRIUM             Index       RIGHT ATRIUM           Index LA diam:        4.20 cm 2.75 cm/m  RA Area:     15.10 cm LA Vol (A2C):   64.2 ml 42.10 ml/m RA Volume:   35.40 ml  23.21 ml/m LA Vol (A4C):   33.9 ml 22.23 ml/m LA Biplane Vol: 50.7 ml 33.24 ml/m  AORTIC VALVE LVOT Vmax:   91.60 cm/s LVOT Vmean:  62.150 cm/s LVOT VTI:    0.186 m  AORTA Ao Asc diam: 2.80 cm MITRAL VALVE               TRICUSPID VALVE MV Area (PHT): 2.29 cm    TR Peak grad:   77.8 mmHg MV Decel Time: 331 msec    TR Vmax:        441.00 cm/s MV E velocity: 50.40 cm/s MV A velocity: 81.00 cm/s  SHUNTS MV E/A ratio:  0.62        Systemic VTI: 0.19 m Olga Millers MD Electronically signed by Olga Millers MD Signature Date/Time: 06/26/2020/2:44:14 PM    Final      Results/Tests Pending at Time of Discharge:  Urine culture  Discharge Medications:  Allergies as of 06/28/2020       Reactions   Aricept [donepezil Hcl] Other (See Comments)   Unknown reaction        Medication List     TAKE these medications    acetaminophen 160 MG/5ML solution Commonly known as: TYLENOL Take 480 mg by mouth every 12 (twelve) hours as needed for headache (pain).   albuterol 108 (90 Base) MCG/ACT inhaler Commonly known as: VENTOLIN HFA Inhale 2 puffs into the lungs every 4 (four) hours as needed for wheezing.   BALMEX EX Apply 1 application topically 2 (two) times daily. Apply to buttocks   cephALEXin 500 MG capsule Commonly known as: KEFLEX Take 1 capsule (500 mg total) by mouth 4 (four) times daily for 6 days.   citalopram 20 MG tablet Commonly known as: CELEXA Take 20 mg by mouth at bedtime.   furosemide 20 MG tablet Commonly known as: LASIX Take 2 tablets (40 mg  total) by mouth every morning. What changed: how much to take  guaifenesin 100 MG/5ML syrup Commonly known as: ROBITUSSIN Take 200 mg by mouth every 4 (four) hours as needed for cough.   LORazepam 0.5 MG tablet Commonly known as: ATIVAN Take 0.5 mg by mouth at bedtime. For anxiety/insomnia   metoprolol tartrate 25 MG tablet Commonly known as: LOPRESSOR Take 0.5 tablets (12.5 mg total) by mouth 2 (two) times daily.   vitamin B-12 1000 MCG tablet Commonly known as: CYANOCOBALAMIN Take 1,000 mcg by mouth every morning.   Vitamin D3 1.25 MG (50000 UT) Caps Take 50,000 Units by mouth every 30 (thirty) days. On or about the 15th of each month        Discharge Instructions: Please refer to Patient Instructions section of EMR for full details.  Patient was counseled important signs and symptoms that should prompt return to medical care, changes in medications, dietary instructions, activity restrictions, and follow up appointments.   Follow-Up Appointments:  Follow-up Information     Jethro Bastos, MD Follow up in 3 day(s).   Specialty: Family Medicine Contact information: [address] [place] [address] [phone]                 Evelena Leyden, DO 06/28/2020, 1:08 PM PGY-1, [facility] Family Medicine

## 2020-06-28 NOTE — ED Triage Notes (Signed)
BIB [facility] from home. Pt is bed bound and hx: dementia. Newly diagnosed with CHF. Baseline non verbal. Pt was just d/c today from [facility]. There was a miscommunication with [facility] oxygen was not delivered to home. Home health came around 2000 and O2 was in the 80s. Pt on a simple face mask @ 2L now 100%.   156/65 58 heart rate 100.7 ax CBG 128.

## 2020-06-28 NOTE — Plan of Care (Signed)
  Problem: Education: Goal: Knowledge of General Education information will improve Description: Including pain rating scale, medication(s)/side effects and non-pharmacologic comfort measures 06/28/2020 0805 by Durward Fortes, RN Outcome: Progressing 06/28/2020 0730 by Durward Fortes, RN Outcome: Progressing   Problem: Health Behavior/Discharge Planning: Goal: Ability to manage health-related needs will improve 06/28/2020 0805 by Durward Fortes, RN Outcome: Progressing 06/28/2020 0730 by Durward Fortes, RN Outcome: Progressing   Problem: Clinical Measurements: Goal: Ability to maintain clinical measurements within normal limits will improve 06/28/2020 0805 by Durward Fortes, RN Outcome: Progressing 06/28/2020 0730 by Durward Fortes, RN Outcome: Progressing Goal: Will remain free from infection 06/28/2020 0805 by Durward Fortes, RN Outcome: Progressing 06/28/2020 0730 by Durward Fortes, RN Outcome: Progressing Goal: Diagnostic test results will improve 06/28/2020 0805 by Durward Fortes, RN Outcome: Progressing 06/28/2020 0730 by Durward Fortes, RN Outcome: Progressing Goal: Respiratory complications will improve 06/28/2020 0805 by Durward Fortes, RN Outcome: Progressing 06/28/2020 0730 by Durward Fortes, RN Outcome: Progressing Goal: Cardiovascular complication will be avoided 06/28/2020 0805 by Durward Fortes, RN Outcome: Progressing 06/28/2020 0730 by Durward Fortes, RN Outcome: Progressing   Problem: Activity: Goal: Risk for activity intolerance will decrease 06/28/2020 0805 by Durward Fortes, RN Outcome: Progressing 06/28/2020 0730 by Durward Fortes, RN Outcome: Progressing   Problem: Nutrition: Goal: Adequate nutrition will be maintained 06/28/2020 0805 by Durward Fortes, RN Outcome: Progressing 06/28/2020 0730 by Durward Fortes, RN Outcome: Progressing   Problem: Coping: Goal: Level of anxiety will decrease 06/28/2020 0805 by Durward Fortes, RN Outcome:  Progressing 06/28/2020 0730 by Durward Fortes, RN Outcome: Progressing   Problem: Elimination: Goal: Will not experience complications related to bowel motility 06/28/2020 0805 by Durward Fortes, RN Outcome: Progressing 06/28/2020 0730 by Durward Fortes, RN Outcome: Progressing Goal: Will not experience complications related to urinary retention 06/28/2020 0805 by Durward Fortes, RN Outcome: Progressing 06/28/2020 0730 by Durward Fortes, RN Outcome: Progressing   Problem: Pain Managment: Goal: General experience of comfort will improve 06/28/2020 0805 by Durward Fortes, RN Outcome: Progressing 06/28/2020 0730 by Durward Fortes, RN Outcome: Progressing   Problem: Safety: Goal: Ability to remain free from injury will improve 06/28/2020 0805 by Durward Fortes, RN Outcome: Progressing 06/28/2020 0730 by Durward Fortes, RN Outcome: Progressing   Problem: Skin Integrity: Goal: Risk for impaired skin integrity will decrease 06/28/2020 0805 by Durward Fortes, RN Outcome: Progressing 06/28/2020 0730 by Durward Fortes, RN Outcome: Progressing   Problem: Activity: Goal: Capacity to carry out activities will improve 06/28/2020 0805 by Durward Fortes, RN Outcome: Progressing 06/28/2020 0730 by Durward Fortes, RN Outcome: Progressing   Problem: Cardiac: Goal: Ability to achieve and maintain adequate cardiopulmonary perfusion will improve 06/28/2020 0805 by Durward Fortes, RN Outcome: Progressing 06/28/2020 0730 by Durward Fortes, RN Outcome: Progressing

## 2020-06-29 DIAGNOSIS — J9601 Acute respiratory failure with hypoxia: Principal | ICD-10-CM

## 2020-06-29 DIAGNOSIS — J9690 Respiratory failure, unspecified, unspecified whether with hypoxia or hypercapnia: Secondary | ICD-10-CM | POA: Diagnosis present

## 2020-06-29 LAB — CBC WITH DIFFERENTIAL/PLATELET
Abs Immature Granulocytes: 0.03 10*3/uL (ref 0.00–0.07)
Basophils Absolute: 0 10*3/uL (ref 0.0–0.1)
Basophils Relative: 1 %
Eosinophils Absolute: 0.1 10*3/uL (ref 0.0–0.5)
Eosinophils Relative: 2 %
HCT: 37.8 % (ref 36.0–46.0)
Hemoglobin: 11.7 g/dL — ABNORMAL LOW (ref 12.0–15.0)
Immature Granulocytes: 1 %
Lymphocytes Relative: 19 %
Lymphs Abs: 1.1 10*3/uL (ref 0.7–4.0)
MCH: 26.1 pg (ref 26.0–34.0)
MCHC: 31 g/dL (ref 30.0–36.0)
MCV: 84.4 fL (ref 80.0–100.0)
Monocytes Absolute: 0.7 10*3/uL (ref 0.1–1.0)
Monocytes Relative: 12 %
Neutro Abs: 4 10*3/uL (ref 1.7–7.7)
Neutrophils Relative %: 65 %
Platelets: 242 10*3/uL (ref 150–400)
RBC: 4.48 MIL/uL (ref 3.87–5.11)
RDW: 16.6 % — ABNORMAL HIGH (ref 11.5–15.5)
WBC: 6 10*3/uL (ref 4.0–10.5)
nRBC: 0 % (ref 0.0–0.2)

## 2020-06-29 LAB — COMPREHENSIVE METABOLIC PANEL
ALT: 11 U/L (ref 0–44)
AST: 32 U/L (ref 15–41)
Albumin: 3.3 g/dL — ABNORMAL LOW (ref 3.5–5.0)
Alkaline Phosphatase: 68 U/L (ref 38–126)
Anion gap: 10 (ref 5–15)
BUN: 16 mg/dL (ref 8–23)
CO2: 25 mmol/L (ref 22–32)
Calcium: 8.7 mg/dL — ABNORMAL LOW (ref 8.9–10.3)
Chloride: 102 mmol/L (ref 98–111)
Creatinine, Ser: 0.78 mg/dL (ref 0.44–1.00)
GFR, Estimated: >60 mL/min (ref >60)
Glucose, Bld: 122 mg/dL — ABNORMAL HIGH (ref 70–99)
Potassium: 3.8 mmol/L (ref 3.5–5.1)
Sodium: 137 mmol/L (ref 135–145)
Total Bilirubin: 0.7 mg/dL (ref 0.3–1.2)
Total Protein: 8.1 g/dL (ref 6.5–8.1)

## 2020-06-29 LAB — URINE CULTURE: Culture: >=100000 — AB

## 2020-06-29 LAB — RESP PANEL BY RT-PCR (FLU A&B, COVID) ARPGX2
Influenza A by PCR: NEGATIVE
Influenza B by PCR: NEGATIVE
SARS Coronavirus 2 by RT PCR: NEGATIVE

## 2020-06-29 LAB — URINALYSIS, ROUTINE W REFLEX MICROSCOPIC
Bilirubin Urine: NEGATIVE
Glucose, UA: NEGATIVE mg/dL
Ketones, ur: NEGATIVE mg/dL
Nitrite: NEGATIVE
Protein, ur: NEGATIVE mg/dL
Specific Gravity, Urine: 1.011 (ref 1.005–1.030)
pH: 6 (ref 5.0–8.0)

## 2020-06-29 LAB — LACTIC ACID, PLASMA
Lactic Acid, Venous: 1 mmol/L (ref 0.5–1.9)
Lactic Acid, Venous: 1.3 mmol/L (ref 0.5–1.9)

## 2020-06-29 LAB — BRAIN NATRIURETIC PEPTIDE: B Natriuretic Peptide: 149.4 pg/mL — ABNORMAL HIGH (ref 0.0–100.0)

## 2020-06-29 LAB — TROPONIN I (HIGH SENSITIVITY)
Troponin I (High Sensitivity): 17 ng/L (ref <18)
Troponin I (High Sensitivity): 19 ng/L — ABNORMAL HIGH (ref <18)

## 2020-06-29 MED ORDER — ALBUTEROL SULFATE (2.5 MG/3ML) 0.083% IN NEBU: 2.5000 mg | INHALATION_SOLUTION | RESPIRATORY_TRACT | Status: DC | PRN

## 2020-06-29 MED ORDER — ENOXAPARIN SODIUM 40 MG/0.4ML IJ SOSY
40.0000 mg | PREFILLED_SYRINGE | INTRAMUSCULAR | Status: DC
  Administered 2020-06-29 – 2020-07-04 (×6): 40 mg via SUBCUTANEOUS
  Filled 2020-06-29 (×6): qty 0.4

## 2020-06-29 MED ORDER — CEPHALEXIN 250 MG PO CAPS
500.0000 mg | ORAL_CAPSULE | Freq: Four times a day (QID) | ORAL | Status: DC
  Administered 2020-06-29 – 2020-07-04 (×21): 500 mg via ORAL
  Filled 2020-06-29 (×22): qty 2

## 2020-06-29 MED ORDER — GUAIFENESIN 100 MG/5ML PO SOLN: 200.0000 mg | ORAL | Status: DC | PRN

## 2020-06-29 MED ORDER — LORAZEPAM 0.5 MG PO TABS
0.5000 mg | ORAL_TABLET | Freq: Every day | ORAL | Status: DC
  Administered 2020-06-29 – 2020-07-01 (×3): 0.5 mg via ORAL
  Filled 2020-06-29 (×3): qty 1

## 2020-06-29 MED ORDER — FUROSEMIDE 40 MG PO TABS
40.0000 mg | ORAL_TABLET | Freq: Every morning | ORAL | Status: DC
  Administered 2020-06-30 – 2020-07-02 (×3): 40 mg via ORAL
  Filled 2020-06-29 (×3): qty 1

## 2020-06-29 MED ORDER — CEPHALEXIN 250 MG PO CAPS: 500.0000 mg | ORAL_CAPSULE | Freq: Four times a day (QID) | ORAL | Status: DC

## 2020-06-29 MED ORDER — METOPROLOL TARTRATE 12.5 MG HALF TABLET
12.5000 mg | ORAL_TABLET | Freq: Two times a day (BID) | ORAL | Status: DC
  Administered 2020-06-29 – 2020-07-04 (×8): 12.5 mg via ORAL
  Filled 2020-06-29 (×10): qty 1

## 2020-06-29 MED ORDER — FUROSEMIDE 10 MG/ML IJ SOLN: 40.0000 mg | Freq: Every day | INTRAMUSCULAR | Status: DC

## 2020-06-29 NOTE — Evaluation (Signed)
Physical Therapy Evaluation Patient Details Name: Amanda Stanton MRN: [id number] DOB: 01-03-1932 Today's Date: 06/29/2020   History of Present Illness  Pt is an 87 y/o female admitted 6/23 secondary to respiratory failure. Pt recently discharged on 6/23 secondary to CHF exacerbation and had to return secondary to hypoxia. PMH includes dmentia, HTN, a fib, and dCHF.  Clinical Impression  Pt admitted secondary to problem above with deficits below. Pt very resistive to mobility tasks and requiring max A for bed mobility tasks. Heavy posterior lean in sitting and was unable to stand. Pt's oxygen sats decreased to 86% on RA and required 2L to return to >90%. Feel pt is likely close to her baseline, but no family present to confirm. Per notes, currently active with [facility]. Would likely benefit from DME below if family does not already have. Pt will likely perform better once in familiar home environment. Will continue to follow acutely.     Follow Up Recommendations Other (comment);Supervision/Assistance - 24 hour ([facility])    Equipment Recommendations  Wheelchair cushion (measurements PT);Wheelchair (measurements PT);Hospital bed;Other (comment) (hoyer lift with pad)    Recommendations for Other Services       Precautions / Restrictions Precautions Precautions: Fall Restrictions Weight Bearing Restrictions: No      Mobility  Bed Mobility Overal bed mobility: Needs Assistance Bed Mobility: Supine to Sit;Sit to Supine;Rolling Rolling: Max assist   Supine to sit: Max assist Sit to supine: Max assist   General bed mobility comments: Max A for all bed mobility. Very resistive to mobility and unable to perform further mobility past sitting EOB. Max A to roll side to side for clean up following BM.    Transfers                    Ambulation/Gait                Stairs            Wheelchair Mobility    Modified Rankin (Stroke Patients Only)       Balance  Overall balance assessment: Needs assistance Sitting-balance support: No upper extremity supported Sitting balance-Leahy Scale: Poor Sitting balance - Comments: Reliant on external support                                     Pertinent Vitals/Pain Pain Assessment: Faces Faces Pain Scale: No hurt    Home Living Family/patient expects to be discharged to:: Private residence Living Arrangements: Children Available Help at Discharge: Family;Available 24 hours/day             Additional Comments: pt unable to provide home information and no family present    Prior Function Level of Independence: Needs assistance   Gait / Transfers Assistance Needed: Per previous notes, pt was ambulatory with assist prior to last hospital admission  ADL's / Homemaking Assistance Needed: dependent with eating, bathing, dressing        Hand Dominance        Extremity/Trunk Assessment   Upper Extremity Assessment Upper Extremity Assessment: Generalized weakness (very stiff and resistive throughout BUE)    Lower Extremity Assessment Lower Extremity Assessment: Generalized weakness (very stiff and resistive throughout BLE)    Cervical / Trunk Assessment Cervical / Trunk Assessment: Kyphotic  Communication   Communication: Expressive difficulties (nonverbal)  Cognition Arousal/Alertness: Awake/alert Behavior During Therapy: Restless Overall Cognitive Status: No family/caregiver present to determine baseline  cognitive functioning                                 General Comments: Dementia at baseline; non verbal      General Comments General comments (skin integrity, edema, etc.): No family present. oxygen sats dropping to 86% on RA with bed mobility tasks.    Exercises     Assessment/Plan    PT Assessment Patient needs continued PT services  PT Problem List Decreased strength;Decreased activity tolerance;Decreased balance;Decreased mobility;Decreased  coordination;Decreased cognition;Decreased knowledge of precautions;Cardiopulmonary status limiting activity;Decreased safety awareness;Decreased knowledge of use of DME;Impaired tone       PT Treatment Interventions DME instruction;Stair training;Gait training;Functional mobility training;Therapeutic activities;Therapeutic exercise;Balance training;Patient/family education    PT Goals (Current goals can be found in the Care Plan section)  Acute Rehab PT Goals PT Goal Formulation: Patient unable to participate in goal setting Time For Goal Achievement: 07/13/20 Potential to Achieve Goals: Fair    Frequency Min 3X/week   Barriers to discharge        Co-evaluation               AM-PAC PT "6 Clicks" Mobility  Outcome Measure Help needed turning from your back to your side while in a flat bed without using bedrails?: A Lot Help needed moving from lying on your back to sitting on the side of a flat bed without using bedrails?: A Lot Help needed moving to and from a bed to a chair (including a wheelchair)?: Total Help needed standing up from a chair using your arms (e.g., wheelchair or bedside chair)?: Total Help needed to walk in hospital room?: Total Help needed climbing 3-5 steps with a railing? : Total 6 Click Score: 8    End of Session   Activity Tolerance: Patient tolerated treatment well Patient left: in bed;with call bell/phone within reach;with bed alarm set Nurse Communication: Mobility status PT Visit Diagnosis: Other abnormalities of gait and mobility (R26.89);Muscle weakness (generalized) (M62.81)    Time: 4627-0350 PT Time Calculation (min) (ACUTE ONLY): 24 min   Charges:   PT Evaluation $PT Eval Moderate Complexity: 1 Mod PT Treatments $Therapeutic Activity: 8-22 mins        Cindee Salt, DPT  Acute Rehabilitation Services  Pager: [phone] Office: [phone]   Lehman Prom 06/29/2020, 12:36 PM

## 2020-06-29 NOTE — Progress Notes (Addendum)
Called patient's daughter concerned that the patient should not have been discharged. She was extremely upset that the patient was not able to breathe at home and was upset that there was not any oxygen set up at home. She felt that even though her breathing was better she should not have been discharged. She was also concerned that [facility] was not made aware of the discharge and that since it is now Friday the patient will not see her physician until Monday.   Daughter was very upset that the team felt she would be stable enough for discharge with close follow-up with [facility]. She stated that if we felt it was appropriate to discharge then she "guessed that would be fine". She stated that it was "terrible for [place]" that we think were okay with discharging her mother.   Discussion was initially started with me and due to daughter becoming frustrated and Dr. Salvadore Dom took over the conversation as daughter was becoming hostile and using profanities. Offered to discuss this further at a different time but she was okay with continuing the discussion. She was not planning to come and see the patient today, though we discussed that if she was concerned about her not being at her baseline then it would be beneficial to come and see the patient and discuss with the team, which she declined to Amanda Stanton.  Discussed that the patient is stable from a medical perspective as she is currently satting well on 2L of oxygen, she is on oral medications that had been adjusted on her discharge yesterday and that the patient will have close follow-up with [facility] on Monday. Patient was discharged with these medications yesterday and should be continued on them as prescribed.  Dr. Deirdre Priest, our attending, was notified of the encounter and will touch base with the daughter to ultimately decide if we will discharge today versus tomorrow and hope that the daughter will come to give her own assessment of the patient to determine where she is in  regards to her baseline.    Amanda Wargo, Amanda Stanton

## 2020-06-29 NOTE — Hospital Course (Addendum)
Amanda Stanton is a 87 y.o. female who presented with labored breathing for 3 days with PMH significant for dementia, HTN.    Increased WOB 2/2 HF Exacerbation Patient with a possible history of CHF on home Lasix presented with labored breathing x3 days.  In the ED, patient was tachypneic and hypoxic to 76% and started on supplemental oxygen at 3L [place]. Labs on admission were notable for elevated lactic acid of 2.4>2.3, BNP of 185, troponin 24>19.  CXR showed pulmonary edema with trace bilateral pleural effusions. CT chest was negative for PE but notable for a nodule as described below.  Echocardiogram showed LVEF 70-75% with hyperdynamic function of LV, mild LVH, grade 1 diastolic dysfunction, flattening of interventricular septum consistent with right ventricular pressure and volume overload, severely elevated pulmonary artery systolic pressure.  Patient was diuresed with IV lasix during hospitalization with significant improvement and transitioned to oral Lasix 40mg  prior to discharge. Patient was discharged on 6/23 and returned on 6/24 as patient was requiring oxygen at home per the home health nurse. Patient was re-admitted to the hospital and physical evaluation with PT showed that patient had O2 saturations of 86% while performing mobility tasks. Patient was discharged with 2L oxygen requirement.    Paroxysmal atrial fibrillation Patient in NSR during this examination wounds with some runs of paroxysmal atrial fibrillation on telemetry.  Cardiology was consulted previously for patient's heart failure but also recommended starting Lopressor 12.5 mg twice daily, which is being continued at discharge.   Concern for pyelonephritis Patient fevered to 101F during hospitalization with no known cause during that time. Blood and urine cultures were collected. Repeat CXR showed improvement in CHF without consolidation. Urinalysis showed leukocytes and bacteria, culture grew >100,000 CFU of Klebsiella oxytoca.  Patient was started on ceftriaxone and received 1 dose before transitioning to oral keflex, which shall be continued to finish a total of 7 antibiotic days.   HTN Patient was hypertensive persistently during hospitalization with systolic typically ranging between 100s-180s. Patient was not on antihypertensive agents prior to admission.  Patient was continued on lopressor in the hospital with recommendation for close follow-up outpatient and now discharging to SNF.   Pulmonary nodule on CT scan CT chest showed 37mm right middle lobe nodule with recommendation for repeat CT at 6-12 months following discharge.     Issues for follow up: Patient with febrile UTI, treated with Keflex 500 mg twice daily for total of 7 days (course finished on 6/29) Right pulmonary nodule will need repeat chest CT in 6-12 months. Consider 8-12 for CHF. BP was elevated during hospitalization, consideration to monitor and add oral antihypertensive as appropriate. Monitor heart rate, patient was started on lopressor 12.5mg  BID by cardiology for paroxysmal atrial fibrillation. Patient required supplemental O2 during ambulation/mobility testing and required home O2.

## 2020-06-29 NOTE — Progress Notes (Addendum)
SATURATION QUALIFICATIONS: (This note is used to comply with regulatory documentation for home oxygen)  Patient Saturations on Room Air at Rest = 90%  Patient Saturations on Room Air while performing mobility tasks= 86%  Patient Saturations on 2 Liters of oxygen while performing mobility tasks = 96%  Please briefly explain why patient needs home oxygen: Pt was unable to ambulate this session, however, when performing bed mobility tasks on RA, pt's sats dropping to 86%. REquired 2L to return to >95%.   Farley Ly, PT, DPT  Acute Rehabilitation Services  Pager: [phone] Office: [phone]

## 2020-06-29 NOTE — TOC Progression Note (Addendum)
Transition of Care [facility]) - Progression Note    Patient Details  Name: Amanda Stanton MRN: [id number] Date of Birth: Jun 04, 1931  Transition of Care [facility]) CM/SW Contact  Leone Haven, RN Phone Number: 06/29/2020, 2:42 PM  Clinical Narrative:    She is a patient of [facility], NCM contacted daughter [name], she states [employer] is at the house now setting up the home oxygen for patient.  Patient has a hospital bed. Daughter states she would like to speak with MD. NCM informed her would have MD to call her.  NCM contacted [facility] and spoke with Stephanie at [phone], she would like to know when patient is being discharged. Patient will need transportation home, if she does not need [facility], then [facility] will transport her home, they will just need to be called at [phone].        Expected Discharge Plan and Services                                                 Social Determinants of Health (SDOH) Interventions    Readmission Risk Interventions No flowsheet data found.

## 2020-06-29 NOTE — Progress Notes (Signed)
Late entry, call completed at 2:30pm.   Called physical therapy to discuss discrepancies and their evaluation from today versus the one from 3 days ago when patient was up and able to walk with minimal assist and with moderate assistance for bed mobility.  Today when evaluated patient was reportedly resisting the physical therapist with movements and required more assistance than previous and they were unable to get her to get up to walk.  Per the note on 6/21, her ability to do some ambulation and ADLs "depends on the day"and patient was at her baseline.  Discussed with the physical therapist from today who stated that she believes patient is likely to do better at home anticipate that she actually would be at her baseline if at home but was resisting working with them in this environment today.  There were recommendations for equipment per the PT note today, unable to confirm with family if any equipment is needed at home.  Keshawn Fiorito, DO

## 2020-06-29 NOTE — Progress Notes (Signed)
Phone call to Patient's daughter   Spoke with her about patients current condition. We decided best medical course is to watch patient overnight on current meds to see if any recurrent of hypoxia. Plan is to contact daughter tomorrow morning with patients status and plans   We discussed phone call from Drs Lilland and Autry-Lott.  I will provide feedback to them from daughter Ms Jazmeen Axtell

## 2020-06-29 NOTE — Progress Notes (Signed)
Family Medicine Teaching Service Daily Progress Note Intern Pager: [phone]  Patient name: Amanda Stanton Medical record number: [id number] Date of birth: [birth year] Age: 87 y.o. Gender: female  Primary Care Provider: Jethro Bastos, MD Consultants: None Code Status: DNR  Pt Overview and Major Events to Date:  6/24 - Readmitted with hypoxia  Assessment and Plan: Amanda Stanton is a 87 y.o. female presenting with hypoxia. PMH is significant for advanced dementia, HTN, diastolic HF, and pAF.   Acute hypoxemic respiratory failure  HFpEF  Pulmonary HTN Patient hypoxic on admission, currently on 2L O2 satting between 88-100% over several minutes while sleeping and appeared very comfortable. Patient did not require oxygen after first day of last admission and had improvement with diuresis. Troponin flat at 17>19, BNP 149.4 and given one dose of IV lasix 40mg . - Continuous O2 monitoring - Supplemental O2 as needed to maintain O2 >92% - Ambulate with pulse ox to determine if home O2 need - Continue home Lasix oral 40mg  daily - Follow up blood cultures  Hypertension BP 126-189/59-90. Manual re-check was 171/79. No home antihypertensive, initially was going to follow-up outpatient with [facility] program to follow, consider amlodipine vs HCTZ as patient does have a fluid overload component and BP and potassium levels can be monitored by [facility] outpatient. - Consider HCTZ for BP control with [facility] follow-up  Recent UTI with concern for pyelo - Continue Keflex 4 times daily until 6/29  Paroxysmal atrial fibrillation - Continue Lopressor 12.5mg  BID    All other chronic problems per H&P   FEN/GI: Dysphagia 2 PPx: Lovenox Dispo:Home pending clinical improvement . Barriers include Home oxygen.   Subjective:  Patient is non-verbal.  Objective: Temp:  [97.6 F (36.4 C)-98.2 F (36.8 C)] 97.6 F (36.4 C) (06/24 0427) Pulse Rate:  [53-67] 57 (06/24 0552) Resp:  [17-21] 19 (06/24 0552) BP:  (126-189)/(59-90) 185/74 (06/24 0552) SpO2:  [91 %-100 %] 92 % (06/24 0427) Weight:  [48.1 kg] 48.1 kg (06/24 0552) Physical Exam: General: NAD, sleeping comfortably Cardiovascular: RRR, systolic murmur appreciated Respiratory: breathing comfortably on 2L [place], O2 saturations while sleeping varied widely from 88-100% repeatedly increasing and decreasing with monitor stating poor reading.  Abdomen: soft, non-distended Extremities: sleeping comfortably in a curled up position, spontaneously moving all extremities during sleep  Laboratory: Recent Labs  Lab 06/26/20 0005 06/27/20 0611 06/29/20 0022  WBC 6.7 4.4 6.0  HGB 11.3* 12.7 11.7*  HCT 35.9* 40.8 37.8  PLT 236 230 242   Recent Labs  Lab 06/26/20 0005 06/27/20 0611 06/29/20 0022  NA 140 139 137  K 3.5 3.7 3.8  CL 104 105 102  CO2 25 26 25   BUN 7* 8 16  CREATININE 0.67 0.61 0.78  CALCIUM 8.7* 8.9 8.7*  PROT  --   --  8.1  BILITOT  --   --  0.7  ALKPHOS  --   --  68  ALT  --   --  11  AST  --   --  32  GLUCOSE 108* 102* 122*     Imaging/Diagnostic Tests: DG Chest Portable 1 View  Result Date: 06/28/2020 CLINICAL DATA:  Shortness of breath EXAM: PORTABLE CHEST 1 VIEW COMPARISON:  06/27/2020, CT chest 06/25/2020 FINDINGS: Cardiomegaly without significant effusion by radiography. Mild diffuse bilateral ground-glass opacity. No pneumothorax. Aortic atherosclerosis. IMPRESSION: Cardiomegaly with mild central congestion and minimal perihilar ground-glass opacity probably mild residual edema Electronically Signed   By: 06/30/2020 M.D.   On: 06/28/2020 23:44  Evelena Leyden, DO 06/29/2020, 8:16 AM PGY-1, [facility] Family Medicine FPTS Intern pager: [phone], text pages welcome

## 2020-06-29 NOTE — H&P (Addendum)
Family Medicine Teaching [facility] Admission History and Physical Service Pager: [phone]  Patient name: Amanda Stanton Medical record number: [id number] Date of birth: 1931-07-06 Age: 87 y.o. Gender: female  Primary Care Provider: Jethro Bastos, MD Consultants: None Code Status:  DNR Preferred Emergency Contact: Amanda Stanton (daughter, legal guardian) [phone]  Chief Complaint: Hypoxia  Assessment and Plan: Amanda Stanton is a 87 y.o. female presenting with hypoxia. PMH is significant for advanced dementia, HTN, diastolic HF, and pAF.   Acute Hypoxemic Respiratory Failure  HFpEF  Pulmonary HTN Patient was discharged from the hospital yesterday, but home O2 was reportedly not delivered. When her home health nurse came this evening, she found patient's O2 sat in the 80s on room air. Placed on 2L [place] with improvement. CXR on admission shows mild residual edema and labs are overall unremarkable. Troponin 17>19 and BNP 149, which are both slightly improved from prior.  Echo during recent admission showed EF 70-75% with grade 1 diastolic dysfunction and severely elevated pulmonary artery systolic pressure. -Admit to FPTS, med-tele, attending Dr. Pollie Meyer -Continuous pulse ox -Supplemental O2 as needed, goal SpO2 >92% -Strict I/Os, daily weights -Lasix 40mg  IV daily -TOC consulted, appreciate assistance with arranging home health O2  Recent UTI w/concern for pyelo Patient febrile during last admission and urine culture grew >100,000 CFU Klebsiella. She was discharged on Keflex four times daily. -Continue Keflex 500mg  four times daily (until 6/29)  Paroxysmal A-Fib Patient in NSR on admission. Cardiology consulted during recent admission and started Lopressor 12.5mg  BID. -Continue Lopressor 12.5mg  BID -Tele monitoring  HTN Patient significantly hypertensive on admission, up to 180s systolic. She was hypertensive during recent admission as well, but no anti-hypertensive  medication started other than Lopressor for paroxysmal a-fib, as her BP was fairly labile. -Monitor BP -Consider addition of antihypertensive -[facility]patient follow up with [facility]  Advanced Dementia Nonverbal at baseline. Requires assistance with all ADLs, but is still ambulatory. -Continue home Ativan 0.5mg  daily at bedtime  Severe Protein Calorie Malnutrition -Dys2 diet -Goals of care discussed during recent hospitalization and family does not want artificial feeding   FEN/GI: DYS 2 diet Prophylaxis: Lovenox  Disposition: med-tele  History of Present Illness:  Amanda Stanton is a 87 y.o. female presenting with acute hypoxemic respiratory failure.   Patient was discharged from the hospital yesterday afternoon following an admission for heart failure exacerbation. When her home health nurse came this evening, her oxygen saturation was found to be in the 80s on room air. Patient was reportedly supposed to go home with home O2 but it was not delivered.   Patient unable to provide any history due to nonverbal status/advanced dementia. No family at bedside.  Review Of Systems: Per HPI with the following additions:   Review of Systems  Unable to perform ROS: Dementia    Patient Active Problem List   Diagnosis Date Noted   Protein-calorie malnutrition, severe 06/28/2020   Nonverbal 06/27/2020   Pulmonary hypertension (HCC) 06/27/2020   Acute respiratory failure with hypoxemia (HCC) 06/26/2020   Acute on chronic diastolic heart failure (HCC) 06/26/2020   QT prolongation 06/26/2020   Pulmonary nodule, right 06/26/2020   Dyspnea 06/25/2020   Hypertension associated with diabetes (HCC) 05/29/2017   Dementia (HCC) 05/29/2017   DM (diabetes mellitus) (HCC) 05/29/2017   CHF (congestive heart failure) (HCC) 05/29/2017   Fever 05/29/2017    Past Medical History: Past Medical History:  Diagnosis Date   Acute decompensated heart failure (HCC) 06/26/2020   Acute respiratory distress  05/29/2017   CHF (congestive heart failure) (HCC)    Dementia (HCC)    Diabetes mellitus without complication (HCC)    Hypertension    Nonverbal 06/27/2020    Past Surgical History: No past surgical history on file.  Social History: Social History   Tobacco Use   Smoking status: Never   Smokeless tobacco: Never  Substance Use Topics   Alcohol use: No    Family History: No pertinent family hx  Allergies and Medications: Allergies  Allergen Reactions   Aricept [Donepezil Hcl] Other (See Comments)    Unknown reaction   No current facility-administered medications on file prior to encounter.   Current Outpatient Medications on File Prior to Encounter  Medication Sig Dispense Refill   acetaminophen (TYLENOL) 160 MG/5ML solution Take 480 mg by mouth every 12 (twelve) hours as needed for headache (pain).     albuterol (VENTOLIN HFA) 108 (90 Base) MCG/ACT inhaler Inhale 2 puffs into the lungs every 4 (four) hours as needed for wheezing.     cephALEXin (KEFLEX) 500 MG capsule Take 1 capsule (500 mg total) by mouth 4 (four) times daily for 6 days. 24 capsule 0   Cholecalciferol (VITAMIN D3) 1.25 MG (50000 UT) CAPS Take 50,000 Units by mouth every 30 (thirty) days. On or about the 15th of each month     citalopram (CELEXA) 20 MG tablet Take 20 mg by mouth at bedtime.     furosemide (LASIX) 20 MG tablet Take 2 tablets (40 mg total) by mouth every morning. 60 tablet 0   guaifenesin (ROBITUSSIN) 100 MG/5ML syrup Take 200 mg by mouth every 4 (four) hours as needed for cough.     LORazepam (ATIVAN) 0.5 MG tablet Take 0.5 mg by mouth at bedtime. For anxiety/insomnia     metoprolol tartrate (LOPRESSOR) 25 MG tablet Take 0.5 tablets (12.5 mg total) by mouth 2 (two) times daily. 30 tablet 0   vitamin B-12 (CYANOCOBALAMIN) 1000 MCG tablet Take 1,000 mcg by mouth every morning.     Zinc Oxide (BALMEX EX) Apply 1 application topically 2 (two) times daily. Apply to buttocks      Objective: BP  126/90   Pulse (!) 54   Temp 98.2 F (36.8 C) (Oral)   Resp 18   SpO2 95%  Exam: General: non-verbal, NAD Eyes: opens eyes on exam, normal sclera and conjunctiva ENTM: dry mucous membranes Neck: supple Cardiovascular: RRR Respiratory: normal effort, faint crackles appreciated Gastrointestinal: soft, no palpable masses Neuro: 5/5 grip strength bilaterally, does not follow commands, non-verbal Ext: 1+ pitting edema in bilateral lower extremities up to proximal shin  Labs and Imaging: CBC BMET  Recent Labs  Lab 06/29/20 0022  WBC 6.0  HGB 11.7*  HCT 37.8  PLT 242   Recent Labs  Lab 06/29/20 0022  NA 137  K 3.8  CL 102  CO2 25  BUN 16  CREATININE 0.78  GLUCOSE 122*  CALCIUM PENDING     EKG: My own interpretation (not copied from electronic read) sinus brady at 54bpm, RBBB  DG Chest Portable 1 View  Result Date: 06/28/2020 CLINICAL DATA:  Shortness of breath EXAM: PORTABLE CHEST 1 VIEW COMPARISON:  06/27/2020, CT chest 06/25/2020 FINDINGS: Cardiomegaly without significant effusion by radiography. Mild diffuse bilateral ground-glass opacity. No pneumothorax. Aortic atherosclerosis. IMPRESSION: Cardiomegaly with mild central congestion and minimal perihilar ground-glass opacity probably mild residual edema Electronically Signed   By: Jasmine Pang M.D.   On: 06/28/2020 23:44     Maury Dus,  MD 06/29/2020, 2:08 AM PGY-1, [facility] Family Medicine FPTS Intern pager: [phone], text pages welcome   FPTS Upper-Level Resident Addendum   I have independently interviewed and examined the patient. I have discussed the above with Dr.Wells agree with the documentation. My edits for correction/addition/clarification are included above. Please see any attending notes.   Ronnald Ramp, MD PGY-2, [facility] 06/29/2020 2:20 AM  FPTS Service pager: [phone] (text pages welcome through [facility])

## 2020-06-30 DIAGNOSIS — J9601 Acute respiratory failure with hypoxia: Secondary | ICD-10-CM | POA: Diagnosis not present

## 2020-06-30 LAB — CULTURE, BLOOD (ROUTINE X 2): Culture: NO GROWTH

## 2020-06-30 MED ORDER — AMLODIPINE BESYLATE 5 MG PO TABS
5.0000 mg | ORAL_TABLET | Freq: Every day | ORAL | Status: DC
  Administered 2020-06-30: 5 mg via ORAL
  Filled 2020-06-30 (×2): qty 1

## 2020-06-30 NOTE — Progress Notes (Signed)
Family Medicine Teaching Service Daily Progress Note Intern Pager: [phone]  Patient name: Amanda Stanton Medical record number: [id number] Date of birth: November 01, 1931 Age: 87 y.o. Gender: female  Primary Care Provider: Jethro Bastos, MD Consultants: None Code Status: DNR  Pt Overview and Major Events to Date:  6/24 - Readmitted with hypoxia  Assessment and Plan: [name] is a 87 y.o. female presenting with hypoxia. PMH is significant for advanced dementia, HTN, diastolic HF, and pAF.   Acute hypoxemic respiratory failure  HFpEF  Pulmonary HTN Patient stable on 2L of oxygen. Per CM notes, O2 has been delivered to pt's home. Pt medically stable for discharge. Daughter interested in SNF placement. Follows with [facility]. Appreciate CSW recommendations regarding disposition.  - TOC consulted  - Supplemental O2 as needed to maintain O2 >92% - Continue home Lasix oral 40mg  daily - Blood culture: no growth to date   Hypertension Hypertensive. - Start amlodipine 5mg    Recent UTI with concern for pyelo Remains afebrile. No leukocytosis. Last fever 6/21 during previous hospitalization.  - Continue Keflex 4 times daily until 6/29  Paroxysmal atrial fibrillation Pt is rate controlled, HR in 50s.  - Continue Lopressor 12.5mg  BID  All other chronic problems stable, per H&P.    FEN/GI: Dysphagia 2 PPx: Lovenox Dispo:Home with [facility] follow-up and home oxygen  Subjective:  No significant overnight events.   Objective: Temp:  [97.6 F (36.4 C)-98.9 F (37.2 C)] 98.5 F (36.9 C) (06/25 0722) Pulse Rate:  [50-104] 54 (06/25 1055) Resp:  [16-18] 16 (06/25 0722) BP: (149-195)/(72-105) 153/78 (06/25 1055) SpO2:  [90 %-99 %] 99 % (06/25 0722) Weight:  [48.2 kg] 48.2 kg (06/25 0058)   Physical Exam: GEN: pt is non-verbal, chronically ill appearing in no acute distress CV: regular rate and rhythm RESP: no increased work of breathing, difficult to auscultate, 2L [place] ABD: Bowel sounds  present, soft, non-tender MSK: edema (baseline) SKIN: warm, dry    Laboratory: Recent Labs  Lab 06/26/20 0005 06/27/20 0611 06/29/20 0022  WBC 6.7 4.4 6.0  HGB 11.3* 12.7 11.7*  HCT 35.9* 40.8 37.8  PLT 236 230 242   Recent Labs  Lab 06/26/20 0005 06/27/20 0611 06/29/20 0022  NA 140 139 137  K 3.5 3.7 3.8  CL 104 105 102  CO2 25 26 25   BUN 7* 8 16  CREATININE 0.67 0.61 0.78  CALCIUM 8.7* 8.9 8.7*  PROT  --   --  8.1  BILITOT  --   --  0.7  ALKPHOS  --   --  68  ALT  --   --  11  AST  --   --  32  GLUCOSE 108* 102* 122*     Imaging/Diagnostic Tests: No results found.   06/29/20, DO 06/30/2020, 12:11 PM PGY-2, [place] [facility] FPTS Intern pager: [phone], text pages welcome

## 2020-06-30 NOTE — TOC Progression Note (Signed)
Transition of Care [facility]) - Progression Note    Patient Details  Name: Amanda Stanton MRN: [id number] Date of Birth: [birth year]  Transition of Care [facility]) CM/SW Contact  Carmina Miller, LCSWA Phone Number: 06/30/2020, 2:49 PM  Clinical Narrative:    CSW was contacted by resident MD stating pt's daughter had changed her mind and wanted pt to go to SNF. Pt's insurance is [facility], only three facilities in network. Business office is closed on weekends, CSW will need to call [facility] SW on Monday to start process. CSW will send referrals today for review on Monday.         Expected Discharge Plan and Services                                                 Social Determinants of Health (SDOH) Interventions    Readmission Risk Interventions No flowsheet data found.

## 2020-06-30 NOTE — Plan of Care (Signed)
  Problem: Activity: Goal: Capacity to carry out activities will improve Outcome: Progressing   Problem: Cardiac: Goal: Ability to achieve and maintain adequate cardiopulmonary perfusion will improve Outcome: Progressing   

## 2020-07-01 DIAGNOSIS — I5033 Acute on chronic diastolic (congestive) heart failure: Secondary | ICD-10-CM | POA: Diagnosis present

## 2020-07-01 DIAGNOSIS — J9601 Acute respiratory failure with hypoxia: Secondary | ICD-10-CM | POA: Diagnosis present

## 2020-07-01 DIAGNOSIS — Z681 Body mass index (BMI) 19 or less, adult: Secondary | ICD-10-CM | POA: Diagnosis not present

## 2020-07-01 DIAGNOSIS — N390 Urinary tract infection, site not specified: Secondary | ICD-10-CM | POA: Diagnosis present

## 2020-07-01 DIAGNOSIS — Z20822 Contact with and (suspected) exposure to covid-19: Secondary | ICD-10-CM | POA: Diagnosis present

## 2020-07-01 DIAGNOSIS — I959 Hypotension, unspecified: Secondary | ICD-10-CM | POA: Diagnosis not present

## 2020-07-01 DIAGNOSIS — J9690 Respiratory failure, unspecified, unspecified whether with hypoxia or hypercapnia: Secondary | ICD-10-CM | POA: Diagnosis present

## 2020-07-01 DIAGNOSIS — E1169 Type 2 diabetes mellitus with other specified complication: Secondary | ICD-10-CM | POA: Diagnosis present

## 2020-07-01 DIAGNOSIS — I480 Paroxysmal atrial fibrillation: Secondary | ICD-10-CM | POA: Diagnosis present

## 2020-07-01 DIAGNOSIS — I272 Pulmonary hypertension, unspecified: Secondary | ICD-10-CM | POA: Diagnosis present

## 2020-07-01 DIAGNOSIS — Z751 Person awaiting admission to adequate facility elsewhere: Secondary | ICD-10-CM | POA: Diagnosis not present

## 2020-07-01 DIAGNOSIS — I152 Hypertension secondary to endocrine disorders: Secondary | ICD-10-CM | POA: Diagnosis present

## 2020-07-01 DIAGNOSIS — R911 Solitary pulmonary nodule: Secondary | ICD-10-CM | POA: Diagnosis present

## 2020-07-01 DIAGNOSIS — F0390 Unspecified dementia without behavioral disturbance: Secondary | ICD-10-CM | POA: Diagnosis present

## 2020-07-01 DIAGNOSIS — B961 Klebsiella pneumoniae [K. pneumoniae] as the cause of diseases classified elsewhere: Secondary | ICD-10-CM | POA: Diagnosis present

## 2020-07-01 DIAGNOSIS — E43 Unspecified severe protein-calorie malnutrition: Secondary | ICD-10-CM | POA: Diagnosis present

## 2020-07-01 DIAGNOSIS — Z66 Do not resuscitate: Secondary | ICD-10-CM | POA: Diagnosis present

## 2020-07-01 LAB — CULTURE, BLOOD (ROUTINE X 2)
Culture: NO GROWTH
Special Requests: ADEQUATE

## 2020-07-01 NOTE — Plan of Care (Signed)

## 2020-07-01 NOTE — Progress Notes (Signed)
Family Medicine Teaching Service Daily Progress Note Intern Pager: [phone]  Patient name: Amanda Stanton Medical record number: [id number] Date of birth: [birth year] Age: 87 y.o. Gender: female  Primary Care Provider: Jethro Bastos, MD Consultants: None Code Status: DNR  Pt Overview and Major Events to Date:  6/24-readmitted with hypoxia  Assessment and Plan: Amanda Stanton is a 87 y.o. female that presented with hypoxia, now improved on oxygen and awaiting SNF placement.  PMH significant for advanced dementia, HTN, diastolic HF and PAF  Acute hypoxemic respiratory failure  HFpEF  pulmonary HTN Patient stable on 2 L O2.  Patient is medically stable for discharge at this time.  Currently planning for SNF placement. - TOC consulted for SNF placement - Monitor O2 to maintain O2 sats over 92% - Continue home Lasix 40 mg  HTN BP lasting for the last 24h: 87-153/40-78. Last BP documented is 87/40. Patient was only started on amlodipine 5 mg yesterday and has not yet received any medications this morning.  We will discontinue the amlodipine at this time and will continue to monitor and will get a manual BP recheck. -Discontinue amlodipine 5 mg - Manual BP recheck  Recent UTI with concern for pyelonephritis Patient mains afebrile, patient is unable to verbalize any other symptoms. - Continue Keflex 4 times daily until 6/29  Paroxysmal atrial fibrillation Rate controlled, patient stable with heart rates in the 50s to 60s.  With current hypotension we will get a repeat but consider if it will be necessary to back off the Lopressor. - Continue Lopressor 12.5 mg twice daily   FEN/GI: Dysphagia 2 PPx: Lovenox Dispo:SNF in 2-3 days. Barriers include SNF placement.   Subjective:  Patient was vocal this morning but unintelligible and does not appear to be in relation to conversation, no concerns from nursing overnight.  Objective: Temp:  [97.5 F (36.4 C)-98.8 F (37.1 C)] 97.5 F  (36.4 C) (06/26 0600) Pulse Rate:  [54-62] 62 (06/26 0600) Resp:  [16-19] 17 (06/26 0600) BP: (87-153)/(40-78) 87/40 (06/26 0600) SpO2:  [99 %] 99 % (06/26 0600) Weight:  [48.9 kg] 48.9 kg (06/26 0258) Physical Exam: General: NAD, resting comfortably in bed Cardiovascular: Regular rate, systolic murmur appreciated Respiratory: Breathing comfortably in no respiratory distress, patient [place] initially not in place as patient had pulled it out, no increased work of breathing Abdomen: Soft, nondistended Extremities: Bandages noted on right heel, patient withdrew from exam when removing sock, unable to visualize left foot due to patient position and unable to follow commands  Laboratory: Recent Labs  Lab 06/26/20 0005 06/27/20 0611 06/29/20 0022  WBC 6.7 4.4 6.0  HGB 11.3* 12.7 11.7*  HCT 35.9* 40.8 37.8  PLT 236 230 242   Recent Labs  Lab 06/26/20 0005 06/27/20 0611 06/29/20 0022  NA 140 139 137  K 3.5 3.7 3.8  CL 104 105 102  CO2 25 26 25   BUN 7* 8 16  CREATININE 0.67 0.61 0.78  CALCIUM 8.7* 8.9 8.7*  PROT  --   --  8.1  BILITOT  --   --  0.7  ALKPHOS  --   --  68  ALT  --   --  11  AST  --   --  32  GLUCOSE 108* 102* 122*      Imaging/Diagnostic Tests: No results found.   , DO 07/01/2020, 8:44 AM PGY-1, [facility] Family Medicine FPTS Intern pager: [phone], text pages welcome

## 2020-07-01 NOTE — Progress Notes (Signed)
FPTS Interim Progress Note  Patient sleeping and resting comfortably.  Rounded with primary RN.  Metoprolol held for HR high 50's. No other concerns voiced.  No orders required.  Appreciated nightly round.  Today's Vitals   06/30/20 1055 06/30/20 1257 06/30/20 1631 06/30/20 2010  BP: (!) 153/78 123/70 (!) 110/50 (!) 96/45  Pulse: (!) 54 60 (!) 59 (!) 58  Resp:  18 19 16   Temp:  98.6 F (37 C) 98.8 F (37.1 C) 98.8 F (37.1 C)  TempSrc:  Oral Oral Oral  SpO2:  99% 99% 99%  Weight:      Height:      PainSc:    0-No pain    , MD 07/01/2020, 2:16 AM PGY-2, [facility] Family Medicine Service pager [phone]

## 2020-07-02 LAB — BASIC METABOLIC PANEL
Anion gap: 7 (ref 5–15)
BUN: 37 mg/dL — ABNORMAL HIGH (ref 8–23)
CO2: 27 mmol/L (ref 22–32)
Calcium: 8.8 mg/dL — ABNORMAL LOW (ref 8.9–10.3)
Chloride: 103 mmol/L (ref 98–111)
Creatinine, Ser: 0.8 mg/dL (ref 0.44–1.00)
GFR, Estimated: >60 mL/min (ref >60)
Glucose, Bld: 120 mg/dL — ABNORMAL HIGH (ref 70–99)
Potassium: 4.2 mmol/L (ref 3.5–5.1)
Sodium: 137 mmol/L (ref 135–145)

## 2020-07-02 LAB — CULTURE, BLOOD (ROUTINE X 2)
Culture: NO GROWTH
Culture: NO GROWTH

## 2020-07-02 MED ORDER — FUROSEMIDE 20 MG PO TABS
20.0000 mg | ORAL_TABLET | Freq: Every morning | ORAL | Status: DC
  Administered 2020-07-03 – 2020-07-04 (×2): 20 mg via ORAL
  Filled 2020-07-02 (×2): qty 1

## 2020-07-02 MED ORDER — VITAMIN B-12 1000 MCG PO TABS
1000.0000 ug | ORAL_TABLET | Freq: Every morning | ORAL | Status: DC
  Administered 2020-07-02 – 2020-07-04 (×3): 1000 ug via ORAL
  Filled 2020-07-02 (×2): qty 1

## 2020-07-02 MED ORDER — SODIUM CHLORIDE 0.9 % IV SOLN: INTRAVENOUS | Status: DC

## 2020-07-02 NOTE — Progress Notes (Addendum)
Family Medicine Teaching Service Daily Progress Note Intern Pager: [phone]  Patient name: Amanda Stanton Medical record number: [id number] Date of birth: [birth year] Age: 87 y.o. Gender: female  Primary Care Provider: Jethro Bastos, MD Consultants: None Code Status: DNR  Pt Overview and Major Events to Date:  6/24-readmitted with hypoxia   Assessment and Plan: Amanda Stanton is a 87 y.o. female that presented with hypoxia, now improved on oxygen and awaiting SNF placement.  PMH significant for advanced dementia, HTN, diastolic HF and PAF  Acute hypoxemic respiratory failure  HFpEF  Pulmonary HTN Patient remains stable on 2L O2 and is breathing comfortably. Patient is currently medically stable for discharge and awaiting SNF placement. Patient has [employer], which limits placement options. UOP has not been measured well as patient has had several episodes requiring bed sheets to be replaced. BUN elevated this morning to 37 with Cr 0.8 (BUN: Cr 46.3, appears pre-renal), all other labs are stable but urine is also dark, patient will need increased oral intake as it will be a fine balance between patient's respiratory status and urine output and kidney function. Do not feel that fluids would be of benefit in this balance at this time after discussion with attending. - TOC consulted for SNF placement - Monitor O2 to maintain sats >92% - Continue Lasix 40mg  orally  HTN BP in the last 24h: 101-140/54-91. Patient Bps have recently been at the higher and lower ends of acceptable blood pressure range. Amlodipine was initially started and discontinued as blood pressures were softer.  - Continue to monitor - Continue Lopressor 12.5mg  BID  Recent UTI with concern for pyelonephritis  Patient remains afebrile  - Continue Keflex QID until 6/29.  Paroxysmal atrial fibrillation HR stable ranging from 58-80 - Continue lopressor 12.5mg  BID.  FEN/GI: Dysphagia 2 PPx: Lovenox Dispo:SNF in 2-3  days. Barriers include SNF placement.   Subjective:  Patient is nonverbal, no concerns from nursing at this point.  Objective: Temp:  [97.8 F (36.6 C)-98.9 F (37.2 C)] 98.9 F (37.2 C) (06/27 0509) Pulse Rate:  [58-80] 58 (06/27 0509) Resp:  [16-18] 16 (06/27 0509) BP: (101-144)/(54-91) 144/76 (06/27 0820) SpO2:  [96 %-99 %] 99 % (06/27 0509) Weight:  [48.5 kg] 48.5 kg (06/27 0509) Physical Exam: General: NAD, sleeping comfortably in the bed curled up on her left side Cardiovascular: Regular rate and rhythm currently Respiratory: Breathing comfortably on 2 L Amanda Stanton, no increased work of breathing, sitting comfortably, difficult to auscultate respiratory sounds Abdomen: Soft,  nondistended Extremities: Moving extremities spontaneously during sleep  Laboratory: Recent Labs  Lab 06/26/20 0005 06/27/20 0611 06/29/20 0022  WBC 6.7 4.4 6.0  HGB 11.3* 12.7 11.7*  HCT 35.9* 40.8 37.8  PLT 236 230 242   Recent Labs  Lab 06/27/20 0611 06/29/20 0022 07/02/20 0430  NA 139 137 137  K 3.7 3.8 4.2  CL 105 102 103  CO2 26 25 27   BUN 8 16 37*  CREATININE 0.61 0.78 0.80  CALCIUM 8.9 8.7* 8.8*  PROT  --  8.1  --   BILITOT  --  0.7  --   ALKPHOS  --  68  --   ALT  --  11  --   AST  --  32  --   GLUCOSE 102* 122* 120*    Imaging/Diagnostic Tests: No results found.   07/04/20, DO 07/02/2020, 8:51 AM PGY-1, [facility] Family Medicine FPTS Intern pager: [phone], text pages welcome

## 2020-07-02 NOTE — Progress Notes (Signed)
Physical Therapy Treatment Patient Details Name: Amanda Stanton MRN: [id number] DOB: [birth year] Today's Date: 07/02/2020    History of Present Illness Pt is an 87 y/o female admitted 6/23 secondary to respiratory failure. Pt recently discharged on 6/23 secondary to CHF exacerbation and had to return secondary to hypoxia. PMH includes dmentia, HTN, a fib, and dCHF.    PT Comments    Pt lethargic today and with poor mobility. On arrival pt lying on her side in fetal position. At end of session worked on passively stretching her lower extremities into extension and was able to get them almost fully extended in supine and left her in that position. Pt at high risk for developing contractures due to her dementia. Expect decline in mobility is combination of acute medical events and natural progression of dementia. Per notes daughter wants pt to go to SNF. Feel that is appropriate to try to assist pt back to baseline with mobility. If pt doesn't progress in a few weeks at SNF she may become nonambulatory long term.    Follow Up Recommendations  SNF     Equipment Recommendations  Wheelchair cushion (measurements PT);Wheelchair (measurements PT);Hospital bed;Other (comment) (hoyer lift with pad)    Recommendations for Other Services       Precautions / Restrictions Precautions Precautions: Fall    Mobility  Bed Mobility Overal bed mobility: Needs Assistance Bed Mobility: Supine to Sit;Sit to Supine     Supine to sit: Total assist Sit to supine: Total assist   General bed mobility comments: assist for all aspects    Transfers Overall transfer level: Needs assistance Equipment used: 1 person hand held assist (Had pt hold onto chair back in front of her) Transfers: Sit to/from Stand Sit to Stand: Max assist         General transfer comment: Heavy assist to bring hips up from the bed. Pt with heavy posterior lean  Ambulation/Gait                 Stairs              Wheelchair Mobility    Modified Rankin (Stroke Patients Only)       Balance Overall balance assessment: Needs assistance Sitting-balance support: No upper extremity supported Sitting balance-Leahy Scale: Poor Sitting balance - Comments: Pt sat EOB x 15 minutes with initial mod assist due to rt lateral lean. Pt progressed to min guard assist and then back to min assist as she fell asleep/fatigued   Standing balance support: Bilateral upper extremity supported Standing balance-Leahy Scale: Zero Standing balance comment: max assist for brief (3 sec) static stand with heavy posterior lean                            Cognition Arousal/Alertness: Lethargic Behavior During Therapy: Flat affect Overall Cognitive Status: No family/caregiver present to determine baseline cognitive functioning                                 General Comments: Dementia at baseline. Pt didn't arouse until I sat her up on EOB. From that point pt with short periods of arousal and then back to sleep. Did say, "I need to pee," at one point after she had just urinated in purewick.      Exercises Other Exercises Other Exercises: Passive stretching of LE's into extension    General Comments General comments (  skin integrity, edema, etc.): Pt on RA with SpO2 92%      Pertinent Vitals/Pain Pain Assessment: Faces Faces Pain Scale: No hurt    Home Living                      Prior Function            PT Goals (current goals can now be found in the care plan section) Progress towards PT goals: Progressing toward goals    Frequency    Min 3X/week      PT Plan Discharge plan needs to be updated    Co-evaluation              AM-PAC PT "6 Clicks" Mobility   Outcome Measure  Help needed turning from your back to your side while in a flat bed without using bedrails?: Total Help needed moving from lying on your back to sitting on the side of a flat bed  without using bedrails?: Total Help needed moving to and from a bed to a chair (including a wheelchair)?: Total Help needed standing up from a chair using your arms (e.g., wheelchair or bedside chair)?: A Lot Help needed to walk in hospital room?: Total Help needed climbing 3-5 steps with a railing? : Total 6 Click Score: 7    End of Session Equipment Utilized During Treatment: Gait belt Activity Tolerance: Patient limited by lethargy Patient left: in bed;with call bell/phone within reach;with bed alarm set Nurse Communication: Mobility status PT Visit Diagnosis: Other abnormalities of gait and mobility (R26.89);Muscle weakness (generalized) (M62.81)     Time: 1105-1130 PT Time Calculation (min) (ACUTE ONLY): 25 min  Charges:  $Therapeutic Activity: 23-37 mins                     [facility] PT Acute Rehabilitation Services Pager [phone] Office [phone]    Angelina Ok [facility] 07/02/2020, 2:40 PM

## 2020-07-02 NOTE — TOC Progression Note (Addendum)
Transition of Care [facility]) - Progression Note    Patient Details  Name: Amanda Stanton MRN: [id number] Date of Birth: 1931-12-08  Transition [facility]) CM/SW Contact  Ivette Loyal, [place] Phone Number: 07/02/2020, 2:21 PM  Clinical Narrative:    1355: CSW contacted [facility] to speak with pt SW, CSW was transferred to [place] SW with no answer but left an voicemail and will follow up at another time.  1442: CSW spoke with Judeth Cornfield Social Worker [phone]) at [facility] who shared SNF that they are in contact with which are [facility] and [place], Csw will contact both facilities for availability and follow back up with [facility] social worker.  1620: CSW spoke with [place] at [place] who states she will follow up tomorrow on offer for this pt, she would like to look more into her notes bc of the advanced dementia. CSW will follow up.        Expected Discharge Plan and Services                                                 Social Determinants of Health (SDOH) Interventions    Readmission Risk Interventions No flowsheet data found.

## 2020-07-03 LAB — SARS CORONAVIRUS 2 (TAT 6-24 HRS): SARS Coronavirus 2: NEGATIVE

## 2020-07-03 MED ORDER — GUAIFENESIN 100 MG/5ML PO SYRP: 200.0000 mg | ORAL_SOLUTION | ORAL | 0 refills | Status: AC | PRN

## 2020-07-03 MED ORDER — ACETAMINOPHEN 160 MG/5ML PO SOLN: 480.0000 mg | Freq: Two times a day (BID) | ORAL | 0 refills | Status: AC | PRN

## 2020-07-03 MED ORDER — METOPROLOL TARTRATE 25 MG PO TABS: 12.5000 mg | ORAL_TABLET | Freq: Two times a day (BID) | ORAL | 0 refills | Status: AC

## 2020-07-03 MED ORDER — BALMEX 11.3 % EX CREA: 1.0000 "application " | TOPICAL_CREAM | Freq: Two times a day (BID) | CUTANEOUS | Status: AC

## 2020-07-03 MED ORDER — VITAMIN D3 1.25 MG (50000 UT) PO CAPS: 50000.0000 [IU] | ORAL_CAPSULE | ORAL | Status: AC

## 2020-07-03 MED ORDER — CITALOPRAM HYDROBROMIDE 20 MG PO TABS: 20.0000 mg | ORAL_TABLET | Freq: Every day | ORAL | Status: AC

## 2020-07-03 MED ORDER — FUROSEMIDE 20 MG PO TABS: 20.0000 mg | ORAL_TABLET | Freq: Every day | ORAL | 0 refills | Status: AC

## 2020-07-03 MED ORDER — ALBUTEROL SULFATE HFA 108 (90 BASE) MCG/ACT IN AERS: 2.0000 | INHALATION_SPRAY | RESPIRATORY_TRACT | Status: AC | PRN

## 2020-07-03 MED ORDER — CEPHALEXIN 500 MG PO CAPS: 500.0000 mg | ORAL_CAPSULE | Freq: Four times a day (QID) | ORAL | 0 refills | Status: AC

## 2020-07-03 MED ORDER — VITAMIN B-12 1000 MCG PO TABS: 1000.0000 ug | ORAL_TABLET | Freq: Every morning | ORAL | Status: AC

## 2020-07-03 NOTE — TOC Progression Note (Signed)
Transition of Care [facility]) - Progression Note    Patient Details  Name: Amanda Stanton MRN: [id number] Date of Birth: [birth year]  Transition of Care [facility]) CM/SW Contact  Ivette Loyal, [place] Phone Number: 07/03/2020, 12:21 PM  Clinical Narrative:    1200: CSW spoke with Georgeann Oppenheim at  [place], who is able to offer pt a bed. Georgeann Oppenheim states that she will reach out to [place] the [facility] CSW to update her about accepting their client. CSW contacted Pt daughter to update her on the facility and DC plan. Pt DC pending COVID test and DC Summary.        Expected Discharge Plan and Services                                                 Social Determinants of Health (SDOH) Interventions    Readmission Risk Interventions No flowsheet data found.

## 2020-07-03 NOTE — NC FL2 (Addendum)
[place] MEDICAID FL2 LEVEL OF CARE SCREENING TOOL     IDENTIFICATION  Patient Name: Amanda Stanton Birthdate: March 16, 1931 Sex: female Admission Date (Current Location): 06/28/2020  [facility] and [place] Number:  Producer, television/film/video and Address:  [facility] [place]. [facility], [address], [place], [place] [address]      Provider Number: [id number]  Attending Physician Name and Address:  Carney Living, MD  Relative Name and Phone Number:  Sharisa Toves [phone]    Current Level of Care: Hospital Recommended Level of Care: Skilled Nursing Facility Prior Approval Number:    Date Approved/Denied:   PASRR Number:  [id number]  Discharge Plan: SNF    Current Diagnoses: Patient Active Problem List   Diagnosis Date Noted   Respiratory failure (HCC) 06/29/2020   Protein-calorie malnutrition, severe 06/28/2020   Nonverbal 06/27/2020   Pulmonary hypertension (HCC) 06/27/2020   Acute respiratory failure with hypoxemia (HCC) 06/26/2020   Acute on chronic diastolic heart failure (HCC) 06/26/2020   QT prolongation 06/26/2020   Pulmonary nodule, right 06/26/2020   Dyspnea 06/25/2020   Hypertension associated with diabetes (HCC) 05/29/2017   Dementia (HCC) 05/29/2017   DM (diabetes mellitus) (HCC) 05/29/2017   CHF (congestive heart failure) (HCC) 05/29/2017   Fever 05/29/2017    Orientation RESPIRATION BLADDER Height & Weight     Self  O2 (2L Nasal Cannula) Incontinent, External catheter Weight: 111 lb 12.4 oz (50.7 kg) Height:  5\' 3"  (160 cm)  BEHAVIORAL SYMPTOMS/MOOD NEUROLOGICAL BOWEL NUTRITION STATUS      Continent Diet (See DC Summary)  AMBULATORY STATUS COMMUNICATION OF NEEDS Skin   Extensive Assist Does not communicate                         Personal Care Assistance Level of Assistance  Feeding, Bathing, Dressing Bathing Assistance: Maximum assistance Feeding assistance: Limited assistance Dressing Assistance: Maximum assistance      Functional Limitations Info  Speech, Hearing, Sight Sight Info: Adequate Hearing Info: Impaired Speech Info: Impaired    SPECIAL CARE FACTORS FREQUENCY  PT (By licensed PT), OT (By licensed OT)     PT Frequency: 5x a week OT Frequency: 5x a week            Contractures Contractures Info: Not present    Additional Factors Info  Code Status, Allergies Code Status Info: DNR Allergies Info: Aricept (donepezil Hcl)           Current Medications (07/03/2020):  This is the current hospital active medication list Current Facility-Administered Medications  Medication Dose Route Frequency Provider Last Rate Last Admin   albuterol (PROVENTIL) (2.5 MG/3ML) 0.083% nebulizer solution 2.5 mg  2.5 mg Inhalation Q4H PRN Simmons-Robinson, Makiera, MD       cephALEXin (KEFLEX) capsule 500 mg  500 mg Oral QID Simmons-Robinson, Makiera, MD   500 mg at 07/03/20 1303   enoxaparin (LOVENOX) injection 40 mg  40 mg Subcutaneous Q24H Simmons-Robinson, Makiera, MD   40 mg at 07/03/20 1037   furosemide (LASIX) tablet 20 mg  20 mg Oral q morning Lilland, Alana, DO   20 mg at 07/03/20 1037   guaiFENesin (ROBITUSSIN) 100 MG/5ML solution 200 mg  200 mg Oral Q4H PRN Simmons-Robinson, Makiera, MD       metoprolol tartrate (LOPRESSOR) tablet 12.5 mg  12.5 mg Oral BID 07/05/20, MD   12.5 mg at 07/03/20 1037   vitamin B-12 (CYANOCOBALAMIN) tablet 1,000 mcg  1,000 mcg Oral q  morning Lilland, Alana, DO   1,000 mcg at 07/03/20 1036     Discharge Medications: Please see discharge summary for a list of discharge medications.  Relevant Imaging Results:  Relevant Lab Results:   Additional Information SSN: [id number]  Ivette Loyal, [place]

## 2020-07-03 NOTE — Progress Notes (Signed)
Family Medicine Teaching Service Daily Progress Note Intern Pager: [phone]  Patient name: Amanda Stanton Medical record number: [id number] Date of birth: Mar 18, 1931 Age: 87 y.o. Gender: female  Primary Care Provider: Jethro Bastos, MD Consultants: Palliative Code Status: DNR  Pt Overview and Major Events to Date:  6/24-readmitted with hypoxia   Assessment and Plan: Amanda Stanton is a 87 y.o. female that presented with hypoxia, now improved on oxygen and awaiting SNF placement.  PMH significant for advanced dementia, HTN, diastolic HF and PAF. Stable for discharge to SNF  Acute hypoxemic respiratory failure  HFpEF  Pulmonary HTN Patient stable on 2L O2 in no respiratory distress and appears comfortable on exam this morning. Lasix dose was decreased and we will continue to monitor for further symptoms.  - TOC consulted for SNF placement - Monitor O2 to maintain sats >92% - Continue Lasix 20mg  orally   HTN BP in the last 24h: 101-144/39-76 - Continue Lopressor 12.5mg  BID   Recent UTI with concern for pyelonephritis Patient remains afebrile - Continue Keflex QID   Paroxysmal atrial fibrillation HR stable in the 50s-70s - Continue lopressor 12.5mg  BID.   FEN/GI: Dysphagia 2 PPx: Lovenox Dispo:SNF  pending SNF placement . Barriers include SNF placement.   Subjective:  Patient is nonverbal, no concern from nursing overnight.  Objective: Temp:  [98.1 F (36.7 C)-98.8 F (37.1 C)] 98.7 F (37.1 C) (06/28 0420) Pulse Rate:  [56-73] 56 (06/28 0420) Resp:  [16] 16 (06/28 0420) BP: (101-126)/(39-67) 101/58 (06/28 0420) SpO2:  [92 %-100 %] 100 % (06/28 0420) Weight:  [50.7 kg] 50.7 kg (06/28 0107) Physical Exam: General: NAD, sitting up in bed being fed breakfast by nurse tech Cardiovascular: Regular rate, systolic murmur appreciated Respiratory: Breathing comfortably on 2 L [place], no increased work of breathing Abdomen: Soft, nondistended Extremities: Moving all extremities  equally and appropriately  Laboratory: Recent Labs  Lab 06/27/20 0611 06/29/20 0022  WBC 4.4 6.0  HGB 12.7 11.7*  HCT 40.8 37.8  PLT 230 242   Recent Labs  Lab 06/27/20 0611 06/29/20 0022 07/02/20 0430  NA 139 137 137  K 3.7 3.8 4.2  CL 105 102 103  CO2 26 25 27   BUN 8 16 37*  CREATININE 0.61 0.78 0.80  CALCIUM 8.9 8.7* 8.8*  PROT  --  8.1  --   BILITOT  --  0.7  --   ALKPHOS  --  68  --   ALT  --  11  --   AST  --  32  --   GLUCOSE 102* 122* 120*      Imaging/Diagnostic Tests: No results found.  07/04/20, DO 07/03/2020, 8:36 AM PGY-1, [facility] Family Medicine FPTS Intern pager: [phone], text pages welcome

## 2020-07-03 NOTE — Discharge Summary (Signed)
Family Medicine [facility] Discharge Summary  Patient name: Amanda Stanton Medical record number: [id number] Date of birth: 1931-06-09 Age: 87 y.o. Gender: female Date of Admission: 06/28/2020  Date of Discharge: 07/04/2020 Admitting Physician: Latrelle Dodrill, MD  Primary Care Provider: Jethro Bastos, MD Consultants: None (cardiology on prior admission)  Indication for Hospitalization: Labored breathing with hypoxia  Discharge Diagnoses/Problem List:  Patient Active Problem List   Diagnosis Date Noted   Respiratory failure (HCC) 06/29/2020   Protein-calorie malnutrition, severe 06/28/2020   Nonverbal 06/27/2020   Pulmonary hypertension (HCC) 06/27/2020   Acute respiratory failure with hypoxemia (HCC) 06/26/2020   Acute on chronic diastolic heart failure (HCC) 06/26/2020   QT prolongation 06/26/2020   Pulmonary nodule, right 06/26/2020   Dyspnea 06/25/2020   Hypertension associated with diabetes (HCC) 05/29/2017   Dementia (HCC) 05/29/2017   DM (diabetes mellitus) (HCC) 05/29/2017   CHF (congestive heart failure) (HCC) 05/29/2017   Disposition: SNF  Discharge Condition: Stable, improved  Discharge Exam:  General: NAD, sitting up in bed being fed breakfast by nurse tech Cardiovascular: Regular rate, systolic murmur appreciated Respiratory: Breathing comfortably with Plessis not in place , no increased work of breathing Abdomen: Soft, nondistended Extremities: Moving all extremities equally and appropriately  Brief Hospital Course:  Amanda Stanton is a 87 y.o. female who presented with labored breathing for 3 days with PMH significant for dementia, HTN.    Increased WOB 2/2 HF Exacerbation Patient with a possible history of CHF on home Lasix presented with labored breathing x3 days.  In the ED, patient was tachypneic and hypoxic to 76% and started on supplemental oxygen at 3L [place]. Labs on admission were notable for elevated lactic acid of 2.4>2.3, BNP of 185,  troponin 24>19.  CXR showed pulmonary edema with trace bilateral pleural effusions. CT chest was negative for PE but notable for a nodule as described below.  Echocardiogram showed LVEF 70-75% with hyperdynamic function of LV, mild LVH, grade 1 diastolic dysfunction, flattening of interventricular septum consistent with right ventricular pressure and volume overload, severely elevated pulmonary artery systolic pressure.  Patient was diuresed with IV lasix during hospitalization with significant improvement and transitioned to oral Lasix 40mg  prior to discharge. Patient was discharged on 6/23 and returned on 6/24 as patient was requiring oxygen at home per the home health nurse. Patient was re-admitted to the hospital and physical evaluation with PT showed that patient had O2 saturations of 86% while performing mobility tasks. Patient was discharged with 2L oxygen requirement.    Paroxysmal atrial fibrillation Patient in NSR during this examination wounds with some runs of paroxysmal atrial fibrillation on telemetry.  Cardiology was consulted previously for patient's heart failure but also recommended starting Lopressor 12.5 mg twice daily, which is being continued at discharge.   Concern for pyelonephritis Patient fevered to 101F during hospitalization with no known cause during that time. Blood and urine cultures were collected. Repeat CXR showed improvement in CHF without consolidation. Urinalysis showed leukocytes and bacteria, culture grew >100,000 CFU of Klebsiella oxytoca. Patient was started on ceftriaxone and received 1 dose before transitioning to oral keflex, which shall be continued to finish a total of 7 antibiotic days.   HTN Patient was hypertensive persistently during hospitalization with systolic typically ranging between 100s-180s. Patient was not on antihypertensive agents prior to admission.  Patient was continued on lopressor in the hospital with recommendation for close follow-up  outpatient and now discharging to SNF.   Pulmonary nodule on CT scan CT  chest showed 6mm right middle lobe nodule with recommendation for repeat CT at 6-12 months following discharge.     Issues for follow up: Patient with febrile UTI, treated with Keflex 500 mg twice daily for total of 7 days (course finished on 6/29) Right pulmonary nodule will need repeat chest CT in 6-12 months. Consider Marcelline Deist for CHF. BP was elevated during hospitalization, consideration to monitor and add oral antihypertensive as appropriate. Monitor heart rate, patient was started on lopressor 12.5mg  BID by cardiology for paroxysmal atrial fibrillation. Patient required supplemental O2 during ambulation/mobility testing and required home O2.   Significant Procedures: None  Significant Labs and Imaging:  Recent Labs  Lab 06/29/20 0022  WBC 6.0  HGB 11.7*  HCT 37.8  PLT 242   Recent Labs  Lab 06/29/20 0022 07/02/20 0430  NA 137 137  K 3.8 4.2  CL 102 103  CO2 25 27  GLUCOSE 122* 120*  BUN 16 37*  CREATININE 0.78 0.80  CALCIUM 8.7* 8.8*  ALKPHOS 68  --   AST 32  --   ALT 11  --   ALBUMIN 3.3*  --     DG Chest 2 View  Result Date: 06/25/2020 CLINICAL DATA:  Shortness of breath.  Wheezing. EXAM: CHEST - 2 VIEW.  Patient is rotated. COMPARISON:  Chest x-ray 05/31/2017 FINDINGS: The heart size and mediastinal contours are unchanged. No definite focal consolidation. Increased interstitial markings. Blunting of bilateral costophrenic angles. Trace bilateral pleural effusions. No pneumothorax. No acute osseous abnormality. IMPRESSION: Pulmonary edema with trace bilateral pleural effusions. Electronically Signed   By: Tish Frederickson M.D.   On: 06/25/2020 01:37   CT Angio Chest PE W and/or Wo Contrast  Result Date: 06/25/2020 CLINICAL DATA:  Suspected pulmonary embolism. EXAM: CT ANGIOGRAPHY CHEST WITH CONTRAST TECHNIQUE: Multidetector CT imaging of the chest was performed using the standard protocol  during bolus administration of intravenous contrast. Multiplanar CT image reconstructions and MIPs were obtained to evaluate the vascular anatomy. CONTRAST:  50mL OMNIPAQUE IOHEXOL 350 MG/ML SOLN COMPARISON:  Chest x-ray from June 25, 2020. FINDINGS: Cardiovascular: Calcified and noncalcified atheromatous plaque of the thoracic aorta. Nonaneurysmal caliber. Aorta is not well opacified. No definite signs of acute aortic process. The heart size is moderately enlarged without pericardial effusion. RIGHT heart enlargement. Reflux of contrast into the hepatic veins during injection. Central pulmonary vasculature with density of 476 Hounsfield units, pulmonary vascular bed is well opacified. Some limited assessment at the distal segmental and subsegmental level at the lung bases, no visible pulmonary embolism Mediastinum/Nodes: No adenopathy in the chest. Mild fullness of RIGHT hilar nodal tissue 1 cm. No thoracic inlet or axillary lymphadenopathy. No gross esophageal abnormality. Lungs/Pleura: Well-circumscribed pulmonary nodule in the RIGHT middle lobe (image 83/6) 6 mm. Septal thickening and small bilateral pleural effusions. Airways are patent. Upper Abdomen: Incidental imaging of upper abdominal contents without acute process. Musculoskeletal: Spinal degenerative changes. No acute bone finding or destructive bone process. Review of the MIP images confirms the above findings. IMPRESSION: 1. No pulmonary embolism. Mildly limited assessment of the lung bases due to respiratory motion as described. 2. Signs of pulmonary edema and pleural effusions. Constellation of findings most suggestive of heart failure. 3. Moderate cardiomegaly with signs of right heart enlargement and reflux of contrast into the hepatic veins during injection. This can be seen in the setting of RIGHT heart dysfunction. 4. 6 mm RIGHT middle lobe pulmonary nodule. Non-contrast chest CT at 6-12 months is recommended. If the nodule is  stable at time of  repeat CT, then future CT at 18-24 months (from today's scan) is considered optional for low-risk patients, but is recommended for high-risk patients. This recommendation follows the consensus statement: Guidelines for Management of Incidental Pulmonary Nodules Detected on CT Images: From the Fleischner Society 2017; Radiology 2017; 284:228-243. 5.  Aortic Atherosclerosis (ICD10-I70.0). Aortic atherosclerosis. Electronically Signed   By: Donzetta Kohut M.D.   On: 06/25/2020 13:44   DG Chest Portable 1 View  Result Date: 06/28/2020 CLINICAL DATA:  Shortness of breath EXAM: PORTABLE CHEST 1 VIEW COMPARISON:  06/27/2020, CT chest 06/25/2020 FINDINGS: Cardiomegaly without significant effusion by radiography. Mild diffuse bilateral ground-glass opacity. No pneumothorax. Aortic atherosclerosis. IMPRESSION: Cardiomegaly with mild central congestion and minimal perihilar ground-glass opacity probably mild residual edema Electronically Signed   By: Jasmine Pang M.D.   On: 06/28/2020 23:44   DG CHEST PORT 1 VIEW  Result Date: 06/27/2020 CLINICAL DATA:  Fever of unknown origin. EXAM: PORTABLE CHEST 1 VIEW COMPARISON:  CT chest and chest radiograph 06/25/2020. FINDINGS: Trachea is midline. Heart is enlarged. Mild interstitial prominence and indistinctness, slightly improved. There may be trace residual left pleural effusion. No dense airspace consolidation. IMPRESSION: Slight improvement in congestive heart failure. Electronically Signed   By: Leanna Battles M.D.   On: 06/27/2020 08:38   ECHOCARDIOGRAM COMPLETE  Result Date: 06/26/2020    ECHOCARDIOGRAM REPORT   Patient Name:   RACQUEL ARKIN Date of Exam: 06/26/2020 Medical Rec #:  [id number]    Height:       63.0 in Accession #:    [id number]   Weight:       114.4 lb Date of Birth:  [birth year]    BSA:          1.525 m Patient Age:    88 years     BP:           156/66 mmHg Patient Gender: F            HR:           72 bpm. Exam Location:  Inpatient Procedure: 2D  Echo, Cardiac Doppler and Color Doppler Indications:    Dyspnea R06.00  History:        Patient has no prior history of Echocardiogram examinations.                 CHF; Risk Factors:Hypertension and Diabetes. Dementia.  Sonographer:    Elmarie Shiley Dance Referring Phys: [id number] DAN FLOYD  Sonographer Comments: Image acquisition challenging due to respiratory motion. IMPRESSIONS  1. Left ventricular ejection fraction, by estimation, is 70 to 75%. The left ventricle has hyperdynamic function. The left ventricle has no regional wall motion abnormalities. There is mild left ventricular hypertrophy. Left ventricular diastolic parameters are consistent with Grade I diastolic dysfunction (impaired relaxation). There is the interventricular septum is flattened in systole and diastole, consistent with right ventricular pressure and volume overload.  2. Right ventricular systolic function is mildly reduced. The right ventricular size is normal. There is severely elevated pulmonary artery systolic pressure.  3. The mitral valve is normal in structure. Trivial mitral valve regurgitation. No evidence of mitral stenosis.  4. The aortic valve is tricuspid. Aortic valve regurgitation is not visualized. No aortic stenosis is present.  5. The inferior vena cava is normal in size with greater than 50% respiratory variability, suggesting right atrial pressure of 3 mmHg. FINDINGS  Left Ventricle: Left ventricular ejection fraction, by estimation, is 70 to 75%. The left ventricle  has hyperdynamic function. The left ventricle has no regional wall motion abnormalities. The left ventricular internal cavity size was normal in size. There is mild left ventricular hypertrophy. The interventricular septum is flattened in systole and diastole, consistent with right ventricular pressure and volume overload. Left ventricular diastolic parameters are consistent with Grade I diastolic dysfunction (impaired relaxation). Right Ventricle: The right  ventricular size is normal.Right ventricular systolic function is mildly reduced. There is severely elevated pulmonary artery systolic pressure. The tricuspid regurgitant velocity is 4.41 m/s, and with an assumed right atrial pressure of 3 mmHg, the estimated right ventricular systolic pressure is 80.8 mmHg. Left Atrium: Left atrial size was normal in size. Right Atrium: Right atrial size was normal in size. Pericardium: Trivial pericardial effusion is present. Mitral Valve: The mitral valve is normal in structure. Trivial mitral valve regurgitation. No evidence of mitral valve stenosis. Tricuspid Valve: The tricuspid valve is normal in structure. Tricuspid valve regurgitation is mild . No evidence of tricuspid stenosis. Aortic Valve: The aortic valve is tricuspid. Aortic valve regurgitation is not visualized. No aortic stenosis is present. Pulmonic Valve: The pulmonic valve was normal in structure. Pulmonic valve regurgitation is mild. No evidence of pulmonic stenosis. Aorta: The aortic root is normal in size and structure. Venous: The inferior vena cava is normal in size with greater than 50% respiratory variability, suggesting right atrial pressure of 3 mmHg. IAS/Shunts: No atrial level shunt detected by color flow Doppler.  LEFT VENTRICLE PLAX 2D LVIDd:         4.50 cm Diastology LVIDs:         2.50 cm LV e' medial:    7.30 cm/s LV PW:         1.00 cm LV E/e' medial:  6.9 LV IVS:        1.20 cm LV e' lateral:   8.45 cm/s                        LV E/e' lateral: 6.0  RIGHT VENTRICLE             IVC RV Basal diam:  2.60 cm     IVC diam: 2.00 cm RV S prime:     11.60 cm/s TAPSE (M-mode): 1.6 cm LEFT ATRIUM             Index       RIGHT ATRIUM           Index LA diam:        4.20 cm 2.75 cm/m  RA Area:     15.10 cm LA Vol (A2C):   64.2 ml 42.10 ml/m RA Volume:   35.40 ml  23.21 ml/m LA Vol (A4C):   33.9 ml 22.23 ml/m LA Biplane Vol: 50.7 ml 33.24 ml/m  AORTIC VALVE LVOT Vmax:   91.60 cm/s LVOT Vmean:  62.150  cm/s LVOT VTI:    0.186 m  AORTA Ao Asc diam: 2.80 cm MITRAL VALVE               TRICUSPID VALVE MV Area (PHT): 2.29 cm    TR Peak grad:   77.8 mmHg MV Decel Time: 331 msec    TR Vmax:        441.00 cm/s MV E velocity: 50.40 cm/s MV A velocity: 81.00 cm/s  SHUNTS MV E/A ratio:  0.62        Systemic VTI: 0.19 m Olga Millers MD Electronically signed by Olga Millers MD Signature Date/Time:  06/26/2020/2:44:14 PM    Final      Results/Tests Pending at Time of Discharge: None  Discharge Medications:  Allergies as of 07/04/2020       Reactions   Aricept [donepezil Hcl] Other (See Comments)   Unknown reaction        Medication List     STOP taking these medications    LORazepam 0.5 MG tablet Commonly known as: ATIVAN       TAKE these medications    acetaminophen 160 MG/5ML solution Commonly known as: TYLENOL Take 15 mLs (480 mg total) by mouth every 12 (twelve) hours as needed for headache (pain).   albuterol 108 (90 Base) MCG/ACT inhaler Commonly known as: VENTOLIN HFA Inhale 2 puffs into the lungs every 4 (four) hours as needed for wheezing.   Balmex 11.3 % Crea cream Generic drug: zinc oxide Apply 1 application topically 2 (two) times daily. Apply to buttocks What changed: medication strength   cephALEXin 500 MG capsule Commonly known as: KEFLEX Take 1 capsule (500 mg total) by mouth 4 (four) times daily for 2 days.   citalopram 20 MG tablet Commonly known as: CELEXA Take 1 tablet (20 mg total) by mouth at bedtime.   furosemide 20 MG tablet Commonly known as: LASIX Take 1 tablet (20 mg total) by mouth daily. What changed:  how much to take when to take this   guaifenesin 100 MG/5ML syrup Commonly known as: ROBITUSSIN Take 10 mLs (200 mg total) by mouth every 4 (four) hours as needed for cough.   metoprolol tartrate 25 MG tablet Commonly known as: LOPRESSOR Take 0.5 tablets (12.5 mg total) by mouth 2 (two) times daily.   vitamin B-12 1000 MCG  tablet Commonly known as: CYANOCOBALAMIN Take 1 tablet (1,000 mcg total) by mouth every morning.   Vitamin D3 1.25 MG (50000 UT) Caps Take 50,000 Units by mouth every 30 (thirty) days. On or about the 15th of each month               Durable Medical Equipment  (From admission, onward)           Start     Ordered   06/29/20 1017  For home use only DME oxygen  Once       Question Answer Comment  Length of Need Lifetime   Mode or (Route) Nasal cannula   Liters per Minute 2   Frequency Continuous (stationary and portable oxygen unit needed)   Oxygen delivery system Gas      06/29/20 1016            Discharge Instructions: Please refer to Patient Instructions section of EMR for full details.  Patient was counseled important signs and symptoms that should prompt return to medical care, changes in medications, dietary instructions, activity restrictions, and follow up appointments.   Follow-Up Appointments:  Follow-up Information     [facility] Follow up.   Why: oxygen Contact information: [address] [place] [address] [phone]         Jethro BastosKoehler, Robert N, MD Follow up.   Specialty: Family Medicine Why: Please foloow up in a week Contact information: [address] [place] [address] [phone]                 Evelena LeydenLilland, Khyla Mccumbers, DO 07/04/2020, 12:06 PM PGY-1, [facility]

## 2020-07-04 LAB — CULTURE, BLOOD (ROUTINE X 2)
Culture: NO GROWTH
Culture: NO GROWTH
Special Requests: ADEQUATE
Special Requests: ADEQUATE

## 2020-07-04 NOTE — Progress Notes (Signed)
Report given to  [facility] nurse all question answered. Patient transport to the SNF by [facility].

## 2020-07-04 NOTE — Care Management Important Message (Signed)
Important Message  Patient Details  Name: Amanda Stanton MRN: [id number] Date of Birth: Apr 25, 1931   Medicare Important Message Given:  Yes   Patient could not sign.  Signed copy left at the patient bedside.   Amanda Stanton 07/04/2020, 2:35 PM

## 2020-07-04 NOTE — Care Management Important Message (Signed)
Important Message  Patient Details  Name: Christinia Lambeth MRN: [id number] Date of Birth: May 19, 1931   Medicare Important Message Given:  Yes     Sheran Newstrom Stefan Church 07/04/2020, 2:35 PM

## 2020-07-04 NOTE — TOC Progression Note (Signed)
Transition of Care [facility]) - Progression Note    Patient Details  Name: Amanda Stanton MRN: [id number] Date of Birth: October 07, 1931  Transition of Care [facility]) CM/SW Contact  Ivette Loyal, [place] Phone Number: 07/04/2020, 10:09 AM  Clinical Narrative:    0945: CSW spoke with pt daughter's who voiced concerns about not being about to choose SNF other than [facility] contracted locations. CSW explained [facility] does not have a bed available and pt has a bed at [facility]. Pt visited [facility] this morning and is wanting pt to go to [facility] but has concerns about [facility] as well. CSW walked pt daughter through medicare.gov website via phone so that she could compare the facilities. She now wants her mother to back at [facility], CSW will continue with DC too [facility] after speaking with Judeth Cornfield the [facility] CSW.        Expected Discharge Plan and Services                                                 Social Determinants of Health (SDOH) Interventions    Readmission Risk Interventions No flowsheet data found.

## 2020-07-04 NOTE — Progress Notes (Signed)
Palliative:  Palliative noted consultation but patient planned for D/C to SNF. Previous palliative consultation with daughter reflects DNR and NO desire for artificial feeding with understanding of expectations with advancing dementia on 06/26/20. Plans for SNF placement and continued [facility] involvement. Patient continues with good intake but overall significant functional decline. Recommend continued conversations with [facility] to address rehospitalization and desire for comfort care as they do an excellent job of continuing to delineate goals of care for their patients.   No charge  Yong Channel, NP Palliative Medicine Team Pager [phone] (Please see [email or web address] for schedule) Team Phone [phone]

## 2020-07-04 NOTE — TOC Transition Note (Signed)
Transition of Care [facility]) - CM/SW Discharge Note   Patient Details  Name: Amanda Stanton MRN: [id number] Date of Birth: December 24, 1931  [facility]) CM/SW Contact:  Lynett Grimes Phone Number: 07/04/2020, 1:18 PM   Clinical Narrative:    Patient will DC to: [facility] Anticipated DC date: 07/04/2020 Family notified: Pt Daughter Transport by: [facility]   Per MD patient ready for DC to [facility]. RN to call report prior to discharge [phone]). RN, patient, patient's family, and facility notified of DC. Discharge Summary and FL2 sent to facility. DC packet on chart. Ambulance transport requested for patient.   CSW will sign off for now as social work intervention is no longer needed. Please consult [place] again if new needs arise.           Patient Goals and CMS Choice        Discharge Placement                       Discharge Plan and Services                                     Social Determinants of Health (SDOH) Interventions     Readmission Risk Interventions No flowsheet data found.

## 2020-07-13 ENCOUNTER — Observation Stay (HOSPITAL_COMMUNITY)
Admission: EM | Admit: 2020-07-13 | Discharge: 2020-07-14 | Disposition: A | Discharge status: Home / Self Care | Payer: Medicare (Managed Care) | Attending: Family Medicine | Admitting: Family Medicine

## 2020-07-13 DIAGNOSIS — R464 Slowness and poor responsiveness: Secondary | ICD-10-CM | POA: Diagnosis present

## 2020-07-13 DIAGNOSIS — I5033 Acute on chronic diastolic (congestive) heart failure: Secondary | ICD-10-CM | POA: Insufficient documentation

## 2020-07-13 DIAGNOSIS — I110 Hypertensive heart disease with heart failure: Secondary | ICD-10-CM | POA: Diagnosis not present

## 2020-07-13 DIAGNOSIS — Z9981 Dependence on supplemental oxygen: Secondary | ICD-10-CM | POA: Diagnosis not present

## 2020-07-13 DIAGNOSIS — F0390 Unspecified dementia without behavioral disturbance: Secondary | ICD-10-CM | POA: Diagnosis not present

## 2020-07-13 DIAGNOSIS — E870 Hyperosmolality and hypernatremia: Secondary | ICD-10-CM | POA: Insufficient documentation

## 2020-07-13 DIAGNOSIS — Z20822 Contact with and (suspected) exposure to covid-19: Secondary | ICD-10-CM | POA: Diagnosis not present

## 2020-07-13 DIAGNOSIS — A419 Sepsis, unspecified organism: Secondary | ICD-10-CM | POA: Diagnosis not present

## 2020-07-13 DIAGNOSIS — R4182 Altered mental status, unspecified: Secondary | ICD-10-CM | POA: Insufficient documentation

## 2020-07-13 DIAGNOSIS — E119 Type 2 diabetes mellitus without complications: Secondary | ICD-10-CM | POA: Diagnosis not present

## 2020-07-13 DIAGNOSIS — Z79899 Other long term (current) drug therapy: Secondary | ICD-10-CM | POA: Diagnosis not present

## 2020-07-13 MED ORDER — LACTATED RINGERS IV SOLN: INTRAVENOUS | Status: DC

## 2020-07-13 MED ORDER — SODIUM CHLORIDE 0.9 % IV SOLN: 1.0000 g | INTRAVENOUS | Status: DC

## 2020-07-13 MED ORDER — ACETAMINOPHEN 650 MG RE SUPP
650.0000 mg | Freq: Once | RECTAL | Status: AC
  Administered 2020-07-14: 650 mg via RECTAL
  Filled 2020-07-13: qty 1

## 2020-07-13 MED ORDER — PIPERACILLIN-TAZOBACTAM 3.375 G IVPB 30 MIN
3.3750 g | Freq: Once | INTRAVENOUS | Status: AC
  Administered 2020-07-14: 3.375 g via INTRAVENOUS
  Filled 2020-07-13: qty 50

## 2020-07-13 MED ORDER — LACTATED RINGERS IV BOLUS (SEPSIS)
500.0000 mL | Freq: Once | INTRAVENOUS | Status: AC
  Administered 2020-07-14: 500 mL via INTRAVENOUS

## 2020-07-13 MED ORDER — VANCOMYCIN HCL IN DEXTROSE 1-5 GM/200ML-% IV SOLN
1000.0000 mg | Freq: Once | INTRAVENOUS | Status: AC
  Administered 2020-07-14: 1000 mg via INTRAVENOUS
  Filled 2020-07-13: qty 200

## 2020-07-13 NOTE — ED Triage Notes (Signed)
Per ems, pt coming from [facility]. Facility reports they went to give pt pain medicines and she was unresponsive. Upon Ems arrival pt is responsive to pain. EMS VS Temp of 100.7, warm to touch, initial HR 150, R 40. ETCO2 30, CBG 195. 116/60. fluid given by ems. 12 lead unremarkable.

## 2020-07-14 ENCOUNTER — Encounter (HOSPITAL_COMMUNITY): Payer: Self-pay | Admitting: Emergency Medicine

## 2020-07-14 ENCOUNTER — Emergency Department (HOSPITAL_COMMUNITY): Payer: Medicare (Managed Care)

## 2020-07-14 ENCOUNTER — Other Ambulatory Visit: Payer: Self-pay

## 2020-07-14 DIAGNOSIS — A419 Sepsis, unspecified organism: Secondary | ICD-10-CM | POA: Diagnosis present

## 2020-07-14 LAB — CBC WITH DIFFERENTIAL/PLATELET
Abs Immature Granulocytes: 0.7 10*3/uL — ABNORMAL HIGH (ref 0.00–0.07)
Basophils Absolute: 0 10*3/uL (ref 0.0–0.1)
Basophils Relative: 0 %
Eosinophils Absolute: 0 10*3/uL (ref 0.0–0.5)
Eosinophils Relative: 0 %
HCT: 36.4 % (ref 36.0–46.0)
Hemoglobin: 11.2 g/dL — ABNORMAL LOW (ref 12.0–15.0)
Immature Granulocytes: 3 %
Lymphocytes Relative: 3 %
Lymphs Abs: 0.6 10*3/uL — ABNORMAL LOW (ref 0.7–4.0)
MCH: 26.3 pg (ref 26.0–34.0)
MCHC: 30.8 g/dL (ref 30.0–36.0)
MCV: 85.4 fL (ref 80.0–100.0)
Monocytes Absolute: 1.6 10*3/uL — ABNORMAL HIGH (ref 0.1–1.0)
Monocytes Relative: 7 %
Neutro Abs: 19.8 10*3/uL — ABNORMAL HIGH (ref 1.7–7.7)
Neutrophils Relative %: 87 %
Platelets: 359 10*3/uL (ref 150–400)
RBC: 4.26 MIL/uL (ref 3.87–5.11)
RDW: 16.6 % — ABNORMAL HIGH (ref 11.5–15.5)
WBC: 22.7 10*3/uL — ABNORMAL HIGH (ref 4.0–10.5)
nRBC: 0 % (ref 0.0–0.2)

## 2020-07-14 LAB — BLOOD CULTURE ID PANEL (REFLEXED) - BCID2

## 2020-07-14 LAB — COMPREHENSIVE METABOLIC PANEL
ALT: 119 U/L — ABNORMAL HIGH (ref 0–44)
AST: 250 U/L — ABNORMAL HIGH (ref 15–41)
Albumin: 2.3 g/dL — ABNORMAL LOW (ref 3.5–5.0)
Alkaline Phosphatase: 98 U/L (ref 38–126)
Anion gap: 9 (ref 5–15)
BUN: 55 mg/dL — ABNORMAL HIGH (ref 8–23)
CO2: 22 mmol/L (ref 22–32)
Calcium: 8.5 mg/dL — ABNORMAL LOW (ref 8.9–10.3)
Chloride: 119 mmol/L — ABNORMAL HIGH (ref 98–111)
Creatinine, Ser: 1.08 mg/dL — ABNORMAL HIGH (ref 0.44–1.00)
GFR, Estimated: 50 mL/min — ABNORMAL LOW (ref >60)
Glucose, Bld: 185 mg/dL — ABNORMAL HIGH (ref 70–99)
Potassium: 3.9 mmol/L (ref 3.5–5.1)
Sodium: 150 mmol/L — ABNORMAL HIGH (ref 135–145)
Total Bilirubin: 2.5 mg/dL — ABNORMAL HIGH (ref 0.3–1.2)
Total Protein: 8.2 g/dL — ABNORMAL HIGH (ref 6.5–8.1)

## 2020-07-14 LAB — RESP PANEL BY RT-PCR (FLU A&B, COVID) ARPGX2
Influenza A by PCR: NEGATIVE
Influenza B by PCR: NEGATIVE
SARS Coronavirus 2 by RT PCR: NEGATIVE

## 2020-07-14 LAB — APTT: aPTT: 29 seconds (ref 24–36)

## 2020-07-14 LAB — BRAIN NATRIURETIC PEPTIDE: B Natriuretic Peptide: 126.4 pg/mL — ABNORMAL HIGH (ref 0.0–100.0)

## 2020-07-14 LAB — PROTIME-INR
INR: 1.5 — ABNORMAL HIGH (ref 0.8–1.2)
Prothrombin Time: 17.9 seconds — ABNORMAL HIGH (ref 11.4–15.2)

## 2020-07-14 LAB — LACTIC ACID, PLASMA: Lactic Acid, Venous: 1.6 mmol/L (ref 0.5–1.9)

## 2020-07-14 MED ORDER — ACETAMINOPHEN 650 MG RE SUPP: 650.0000 mg | Freq: Four times a day (QID) | RECTAL | Status: DC | PRN

## 2020-07-14 MED ORDER — LACTATED RINGERS IV BOLUS
1000.0000 mL | Freq: Once | INTRAVENOUS | Status: AC
  Administered 2020-07-14: 1000 mL via INTRAVENOUS

## 2020-07-14 MED ORDER — GUAIFENESIN 100 MG/5ML PO SOLN: 200.0000 mg | ORAL | Status: DC | PRN

## 2020-07-14 MED ORDER — METOPROLOL TARTRATE 5 MG/5ML IV SOLN
2.5000 mg | Freq: Four times a day (QID) | INTRAVENOUS | Status: DC
  Filled 2020-07-14: qty 5

## 2020-07-14 MED ORDER — HEPARIN SODIUM (PORCINE) 5000 UNIT/ML IJ SOLN: 5000.0000 [IU] | Freq: Three times a day (TID) | INTRAMUSCULAR | Status: DC

## 2020-07-14 MED ORDER — METOPROLOL TARTRATE 25 MG PO TABS: 25.0000 mg | ORAL_TABLET | Freq: Two times a day (BID) | ORAL | Status: DC

## 2020-07-14 MED ORDER — METOPROLOL TARTRATE 5 MG/5ML IV SOLN: 2.5000 mg | Freq: Four times a day (QID) | INTRAVENOUS | Status: DC

## 2020-07-14 MED ORDER — ACETAMINOPHEN 325 MG PO TABS: 650.0000 mg | ORAL_TABLET | Freq: Four times a day (QID) | ORAL | Status: DC | PRN

## 2020-07-16 LAB — CULTURE, BLOOD (ROUTINE X 2): Special Requests: ADEQUATE

## 2020-07-20 ENCOUNTER — Encounter: Payer: Self-pay | Admitting: Family Medicine

## 2020-07-20 NOTE — Progress Notes (Signed)
Chart entered to complete death certificate.

## 2020-08-06 NOTE — Progress Notes (Signed)
   2020-07-15 0600  Clinical Encounter Type  Visited With Patient and family together  Visit Type Spiritual support;Social support  Referral From Physician  Consult/Referral To Chaplain  Spiritual Encounters  Spiritual Needs Emotional;Grief support;Prayer  The chaplain responded to a page concerning a patient death in the ED. The chaplain spoke directly with the doctor who informed the family. The patient met with the family at bedside. The family (Daughter Fransisco Beau) and Brother Ozzie Hoyle) spoke with the chaplain. The chaplain provided grief and social support while they were present in the room. The daughter mentioned the mother wanted to be buried at a family plot in [place], but she does not recall the name. The chaplain provided a patient placement card to the daughter. The chaplain will follow up as needed.

## 2020-08-06 NOTE — Progress Notes (Signed)
Code Sepsis tracking by eLINK 

## 2020-08-06 NOTE — ED Notes (Signed)
Admitting MD Simmons-Robinson at bedside.

## 2020-08-06 NOTE — ED Notes (Signed)
Daughter [name] [phone] would like an update asap

## 2020-08-06 NOTE — Death Summary Note (Signed)
Family Medicine Teaching [facility] Death Summary  Patient name: Iasia Forcier Medical record number: [id number] Date of birth: [birth year] Age: 87 y.o. Gender: female Date of Admission: 20-Jul-2020  Date of Death: 07-21-2020  Admitting Physician: Moses Manners, MD  Primary Care Provider: Jethro Bastos, MD  Indication for Hospitalization: Sepsis rule out, altered mental status   Disposition: Morgue  Discharge Condition: Deceased  Physical Exam  General: Chronically ill-appearing, thin and frail female with no spontaneous movements for blinking, no respirations Cardio: No heart sounds after 30 seconds of auscultation Pulm: No visible respirations nor lung sounds upon auscultation for 30 seconds  Brief Hospital Course:  Ailie Gage is 87 y.o. female who presented to the ED on 2020-07-20 from skilled nursing facility with concern for decreased responsiveness.  Patient was found to have elevated white blood cell count of 22.7 as well as hypernatremia with a level of 150.  Patient was tachycardic, tachypneic and hypoxic requiring 10 L on nonrebreather.  She was given supplemental oxygen, started on antibiotics including vancomycin and Zosyn and fluids per sepsis protocol.  Patient was admitted to family practice teaching service prior to decline in vital signs.  Team was notified by nursing of the patient's worsening tachycardia and hypotension.  She rapidly progressed to asystole with worsening hypotension.  Her daughter, Paz Fuentes, was notified via telephone of her mother's impending demise and patient's daughter agreed to come into the emergency room as soon as possible and confirmed that her mother was to be under comfort measures only.  Orders were updated to reflect patient's daughters confirmation of comfort care [phone].  The patient subsequently died at 0454 on 2020/07/21 as pronounced by Dr. Audley Hose.  Significant Labs and Imaging:  Recent Labs  Lab July 21, 2020 0042  WBC 22.7*  HGB 11.2*   HCT 36.4  PLT 359   Recent Labs  Lab July 21, 2020 0042  NA 150*  K 3.9  CL 119*  CO2 22  GLUCOSE 185*  BUN 55*  CREATININE 1.08*  CALCIUM 8.5*  ALKPHOS 98  AST 250*  ALT 119*  ALBUMIN 2.3*     Simmons-Robinson, Terena Bohan, MD 07/21/20, 5:26 AM PGY-3, [place] [facility]

## 2020-08-06 NOTE — H&P (Addendum)
Family Medicine Teaching [facility] Admission History and Physical Service Pager: [phone]  Patient name: Amanda Stanton Medical record number: [id number] Date of birth: March 02, 1931 Age: 87 y.o. Gender: female  Primary Care Provider: Jethro Bastos, MD Consultants: none Code Status: DNR Preferred Emergency Contact: Ruben Im, [phone]  Chief Complaint: decreased responsiveness  Assessment and Plan: Amanda Stanton is a 87 y.o. female presenting with decreased responsiveness. PMH is significant for HFrEF, Dementia (nonverbal), DM, HTN, prolonged QT, pHTN  AMS in setting of Hypernatremia  Sepsis Patient reported to have decreased responsiveness from her baseline by nursing home staff.  Upon presentation to ED, patient found to be febrile to 103,  tachycardic in atrial fibrillation versus with heart rate as high as 145bpm.  On exam,patient with increased work of breathing and requiring up to 10 L on nonrebreather mask.  Patient continues to be nonverbal and unresponsive to efforts to wake her including voice and sternal rub.  Retractions noted on exam.  Patient's current state discussed with daughter who states she would like her mother to transition to comfort care and patient is to be DNR. Labs notable for white blood cell count 22.7.  Sodium of 150. LA within normal limits at 1.6. No report of seizure-like activity from nursing home staff. - Admit to if FPTS, progressive, attending Dr. Leveda Anna - Continue antibiotics including Vanc and Zosyn - f/u blood cultures   - Vitals per floor protocol - Cardiac monitoring - Delirium precautions  Paroxysmal atrial fibrillation During previous admission nearly 2 weeks ago, patient was evaluated by cardiology and started on metoprolol 12.5 mg twice daily.  Heart rate elevated in ED with max of 145.  Patient intermittently in atrial fibrillation. -Due to decreased mental status, will switch from PO to IV metoprolol, 2.5 mg q6  -We will monitor  blood pressure and if patient is too hypotensive, will consider switching to diltiazem - cardiac monitoring   Heart failure preserved ejection fraction Home medications include metoprolol 12.5 mg twice daily. Recent echo showed ejection fraction 70-75%, no regional wall abnormalities, left ventricular hypertrophy, grade 1 diastolic dysfunction, right ventricle systolic function mildly reduced, elevated pulmonary artery pressure trivial mitral valve regurg.  BNP on admission elevated at 126.4. -Continue metoprolol 12.5 twice daily as blood pressure tolerates - cardiac monitoring   Dementia Patient is nonverbal at baseline. Daugther is primary caretaker. Patient recently discharged to SNF. Throughout recent admissions, patient has been progressively declining. Admission earlier this month -Delirium precautions  Hypertension Blood pressure low in ED ranging from 90s to low 100s. -Monitor vital signs  DM  BG on admission elevated 185. No prior DM medications noted. Last A1c is 5.6 in June 2022.  - monitor CBG - goal 140-180 - no insulin therapy at this time   Transaminitis  Patient AST elevated at 250, ALT 119. Bilirubin elevated at 2.5. Previously WNL in June of 2022. May be complication of septic picture upon presentation. Patient with low PO intake and hx of protein calorie - monitor AST/ALT daily  Severe Protein Malnutrition  Patient thin and frail appearing on exam. On nutritional supplements during prior admissions.  - continue nutritional supplements   FEN/GI: N.P.O.  Prophylaxis: Subcutaneous heparin  Disposition: Progressive  History of Present Illness:  Amanda Stanton is a 87 y.o. female presenting with decreased responsiveness concerning for sepsis.  Patient reported to have decreased responsiveness from baseline from nursing facility staff.  Upon arrival to the ED, patient found to be tachycardic to a rate of 150  bpm and intermittently going from atrial fibrillation to  sinus tachycardia.  Patient also with leukocytosis with white blood cell count of 22.7.  Patient also noted to be hypernatremic with a sodium level of 150.  Lactic acid 1.6  Review Of Systems: Per HPI with the following additions:   Review of Systems  Unable to perform ROS: Dementia    Patient Active Problem List   Diagnosis Date Noted   Sepsis (HCC) 11-Aug-2020   Respiratory failure (HCC) 06/29/2020   Protein-calorie malnutrition, severe 06/28/2020   Nonverbal 06/27/2020   Pulmonary hypertension (HCC) 06/27/2020   Acute respiratory failure with hypoxemia (HCC) 06/26/2020   Acute on chronic diastolic heart failure (HCC) 06/26/2020   QT prolongation 06/26/2020   Pulmonary nodule, right 06/26/2020   Dyspnea 06/25/2020   Hypertension associated with diabetes (HCC) 05/29/2017   Dementia (HCC) 05/29/2017   DM (diabetes mellitus) (HCC) 05/29/2017   CHF (congestive heart failure) (HCC) 05/29/2017   Fever 05/29/2017    Past Medical History: Past Medical History:  Diagnosis Date   Acute decompensated heart failure (HCC) 06/26/2020   Acute respiratory distress 05/29/2017   CHF (congestive heart failure) (HCC)    Dementia (HCC)    Diabetes mellitus without complication (HCC)    Hypertension    Nonverbal 06/27/2020    Past Surgical History: History reviewed. No pertinent surgical history.  Social History: Social History   Tobacco Use   Smoking status: Never   Smokeless tobacco: Never  Substance Use Topics   Alcohol use: No     Family History: No family history on file.  Allergies and Medications: Allergies  Allergen Reactions   Aricept [Donepezil Hcl] Other (See Comments)    Unknown reaction   No current facility-administered medications on file prior to encounter.   Current Outpatient Medications on File Prior to Encounter  Medication Sig Dispense Refill   acetaminophen (TYLENOL) 160 MG/5ML solution Take 15 mLs (480 mg total) by mouth every 12 (twelve) hours as  needed for headache (pain). 120 mL 0   albuterol (VENTOLIN HFA) 108 (90 Base) MCG/ACT inhaler Inhale 2 puffs into the lungs every 4 (four) hours as needed for wheezing.     bisacodyl (DULCOLAX) 10 MG suppository Place 10 mg rectally as needed for moderate constipation (if no relief from milk of magnesium).     Cholecalciferol (VITAMIN D3) 1.25 MG (50000 UT) CAPS Take 50,000 Units by mouth every 30 (thirty) days. On or about the 15th of each month 1 capsule    citalopram (CELEXA) 20 MG tablet Take 1 tablet (20 mg total) by mouth at bedtime.     furosemide (LASIX) 20 MG tablet Take 1 tablet (20 mg total) by mouth daily. 30 tablet 0   guaifenesin (ROBITUSSIN) 100 MG/5ML syrup Take 10 mLs (200 mg total) by mouth every 4 (four) hours as needed for cough. 120 mL 0   magnesium hydroxide (MILK OF MAGNESIA) 400 MG/5ML suspension Take 30 mLs by mouth daily as needed for mild constipation.     metoprolol tartrate (LOPRESSOR) 25 MG tablet Take 0.5 tablets (12.5 mg total) by mouth 2 (two) times daily. (Patient taking differently: Take 25 mg by mouth 2 (two) times daily.) 30 tablet 0   Nutritional Supplements (ENSURE ENLIVE PO) Take 237 mLs by mouth See admin instructions. 1 bid and magic cup     OXYGEN Inhale 2 L into the lungs continuous.     Sodium Phosphates (ENEMA RE) Place 1 enema rectally daily as needed (constipation not relieved  by milk of magnesium or bisacodyl suppository).     vitamin B-12 (CYANOCOBALAMIN) 1000 MCG tablet Take 1 tablet (1,000 mcg total) by mouth every morning.     cephALEXin (KEFLEX) 500 MG capsule Take 500 mg by mouth See admin instructions. Qid x days (Patient not taking: No sig reported)     zinc oxide (BALMEX) 11.3 % CREA cream Apply 1 application topically 2 (two) times daily. Apply to buttocks (Patient not taking: No sig reported)      Objective: BP 110/61   Pulse (!) 138   Temp 99.5 F (37.5 C) (Axillary)   Resp (!) 26   Ht 5\' 3"  (1.6 m)   Wt 50 kg   SpO2 98%   BMI  19.53 kg/m   Exam: General: Elderly, thin female, not opening eyes to voice or sternal rub, nonrebreather mask Eyes: Closed ENTM: Saliva collecting at corner of mouth Cardiovascular: Tachycardia Respiratory: Increased work of breathing, on nonrebreather 10 L Gastrointestinal: Soft, scaphoid MSK: Not moving extremities spontaneously Derm: No visible lacerations or lesions Neuro: Unresponsive to voice nor sternal rub   Labs and Imaging: CBC BMET  Recent Labs  Lab Aug 04, 2020 0042  WBC 22.7*  HGB 11.2*  HCT 36.4  PLT 359   Recent Labs  Lab 08/04/2020 0042  NA 150*  K 3.9  CL 119*  CO2 22  BUN 55*  CREATININE 1.08*  GLUCOSE 185*  CALCIUM 8.5*     EKG: Atrial fibrillation alternating with sinus tachycardia on subsequent EKGs    Simmons-Robinson, 09/14/20, MD Aug 04, 2020, 5:25 AM PGY-2, [place] [facility] FPTS Intern pager: [phone], text pages welcome

## 2020-08-06 NOTE — ED Notes (Signed)
ER MD Dr. Audley Hose notified to confirm TOD. Time of Death noted at 22. Family is on the way.

## 2020-08-06 NOTE — ED Provider Notes (Addendum)
[facility] EMERGENCY DEPARTMENT Provider Note   CSN: [id number] Arrival date & time: 07/13/20  2335     History Chief Complaint  Patient presents with   Unresponsive    Amanda Stanton is a 87 y.o. female.  Patient presents from nursing home chief complaint of decreased responsiveness and fevers.  Patient recently discharged after recurrent admissions for sepsis UTI.  No reports of any vomiting or diarrhea given.  Unable to obtain history or review of systems in the patient herself as she has severe dementia and is unresponsive.      Past Medical History:  Diagnosis Date   Acute decompensated heart failure (HCC) 06/26/2020   Acute respiratory distress 05/29/2017   CHF (congestive heart failure) (HCC)    Dementia (HCC)    Diabetes mellitus without complication (HCC)    Hypertension    Nonverbal 06/27/2020    Patient Active Problem List   Diagnosis Date Noted   Respiratory failure (HCC) 06/29/2020   Protein-calorie malnutrition, severe 06/28/2020   Nonverbal 06/27/2020   Pulmonary hypertension (HCC) 06/27/2020   Acute respiratory failure with hypoxemia (HCC) 06/26/2020   Acute on chronic diastolic heart failure (HCC) 06/26/2020   QT prolongation 06/26/2020   Pulmonary nodule, right 06/26/2020   Dyspnea 06/25/2020   Hypertension associated with diabetes (HCC) 05/29/2017   Dementia (HCC) 05/29/2017   DM (diabetes mellitus) (HCC) 05/29/2017   CHF (congestive heart failure) (HCC) 05/29/2017   Fever 05/29/2017    History reviewed. No pertinent surgical history.   OB History   No obstetric history on file.     No family history on file.  Social History   Tobacco Use   Smoking status: Never   Smokeless tobacco: Never  Substance Use Topics   Alcohol use: No    Home Medications Prior to Admission medications   Medication Sig Start Date End Date Taking? Authorizing Provider  acetaminophen (TYLENOL) 160 MG/5ML solution Take 15 mLs (480 mg total)  by mouth every 12 (twelve) hours as needed for headache (pain). 07/03/20  Yes Lilland, Alana, DO  albuterol (VENTOLIN HFA) 108 (90 Base) MCG/ACT inhaler Inhale 2 puffs into the lungs every 4 (four) hours as needed for wheezing. 07/03/20  Yes Lilland, Alana, DO  bisacodyl (DULCOLAX) 10 MG suppository Place 10 mg rectally as needed for moderate constipation (if no relief from milk of magnesium).   Yes [provider]  Cholecalciferol (VITAMIN D3) 1.25 MG (50000 UT) CAPS Take 50,000 Units by mouth every 30 (thirty) days. On or about the 15th of each month 07/03/20  Yes Lilland, Alana, DO  citalopram (CELEXA) 20 MG tablet Take 1 tablet (20 mg total) by mouth at bedtime. 07/03/20  Yes Lilland, Alana, DO  furosemide (LASIX) 20 MG tablet Take 1 tablet (20 mg total) by mouth daily. 07/03/20 08/02/20 Yes Lilland, Alana, DO  guaifenesin (ROBITUSSIN) 100 MG/5ML syrup Take 10 mLs (200 mg total) by mouth every 4 (four) hours as needed for cough. 07/03/20  Yes Lilland, Alana, DO  magnesium hydroxide (MILK OF MAGNESIA) 400 MG/5ML suspension Take 30 mLs by mouth daily as needed for mild constipation.   Yes [provider]  metoprolol tartrate (LOPRESSOR) 25 MG tablet Take 0.5 tablets (12.5 mg total) by mouth 2 (two) times daily. Patient taking differently: Take 25 mg by mouth 2 (two) times daily. 07/03/20 08/02/20 Yes Lilland, Alana, DO  Nutritional Supplements (ENSURE ENLIVE PO) Take 237 mLs by mouth See admin instructions. 1 bid and magic cup   Yes  [provider]  OXYGEN Inhale 2 L into the lungs continuous.   Yes [provider]  Sodium Phosphates (ENEMA RE) Place 1 enema rectally daily as needed (constipation not relieved by milk of magnesium or bisacodyl suppository).   Yes [provider]  vitamin B-12 (CYANOCOBALAMIN) 1000 MCG tablet Take 1 tablet (1,000 mcg total) by mouth every morning. 07/03/20  Yes Lilland, Alana, DO  cephALEXin (KEFLEX) 500 MG capsule Take 500 mg by  mouth See admin instructions. Qid x days Patient not taking: No sig reported    [provider]  zinc oxide (BALMEX) 11.3 % CREA cream Apply 1 application topically 2 (two) times daily. Apply to buttocks Patient not taking: No sig reported 07/03/20   Evelena Leyden, DO    Allergies    Aricept [donepezil hcl]  Review of Systems   Review of Systems  Unable to perform ROS: Dementia   Physical Exam Updated Vital Signs BP 137/62 (BP Location: Left Arm)   Pulse (!) 130   Temp (S) (!) 103.3 F (39.6 C) (Rectal)   Resp (!) 31   Ht 5\' 3"  (1.6 m)   Wt 50 kg   SpO2 96%   BMI 19.53 kg/m   Physical Exam Constitutional:      Comments: Obtunded, no response to painful stimuli.  HENT:     Head: Normocephalic.     Nose: Nose normal.  Eyes:     Conjunctiva/sclera: Conjunctivae normal.  Cardiovascular:     Rate and Rhythm: Tachycardia present.  Pulmonary:     Comments: Increased work of breathing is present. Neurological:     Comments: Obtunded, minimally responsive.    ED Results / Procedures / Treatments   Labs (all labs ordered are listed, but only abnormal results are displayed) Labs Reviewed  COMPREHENSIVE METABOLIC PANEL - Abnormal; Notable for the following components:      Result Value   Sodium 150 (*)    Chloride 119 (*)    Glucose, Bld 185 (*)    BUN 55 (*)    Creatinine, Ser 1.08 (*)    Calcium 8.5 (*)    Total Protein 8.2 (*)    Albumin 2.3 (*)    AST 250 (*)    ALT 119 (*)    Total Bilirubin 2.5 (*)    GFR, Estimated 50 (*)    All other components within normal limits  CBC WITH DIFFERENTIAL/PLATELET - Abnormal; Notable for the following components:   WBC 22.7 (*)    Hemoglobin 11.2 (*)    RDW 16.6 (*)    Neutro Abs 19.8 (*)    Lymphs Abs 0.6 (*)    Monocytes Absolute 1.6 (*)    Abs Immature Granulocytes 0.70 (*)    All other components within normal limits  PROTIME-INR - Abnormal; Notable for the following components:   Prothrombin Time 17.9  (*)    INR 1.5 (*)    All other components within normal limits  RESP PANEL BY RT-PCR (FLU A&B, COVID) ARPGX2  CULTURE, BLOOD (ROUTINE X 2)  CULTURE, BLOOD (ROUTINE X 2)  URINE CULTURE  LACTIC ACID, PLASMA  APTT  LACTIC ACID, PLASMA  URINALYSIS, ROUTINE W REFLEX MICROSCOPIC  BRAIN NATRIURETIC PEPTIDE    EKG EKG Interpretation  Date/Time:  Saturday 07-18-20 00:24:19 EDT Ventricular Rate:  120 PR Interval:  132 QRS Duration: 91 QT Interval:  312 QTC Calculation: 441 R Axis:   -86 Text Interpretation: Sinus tachycardia Multiform ventricular premature complexes Left anterior  fascicular block RSR' in V1 or V2, right VCD or RVH Consider left ventricular hypertrophy Confirmed by Norman Clay ([id number]) on 08-01-20 1:41:19 AM  Radiology DG Chest Port 1 View  Result Date: Aug 01, 2020 CLINICAL DATA:  87 year old female with concern for sepsis. EXAM: PORTABLE CHEST 1 VIEW COMPARISON:  Chest radiograph dated 06/28/2020. FINDINGS: No focal consolidation, pleural effusion or pneumothorax. Stable cardiomegaly. Atherosclerotic calcification of the aorta. Osteopenia with degenerative changes of the spine. No acute osseous pathology. IMPRESSION: No active disease. Electronically Signed   By: Elgie Collard M.D.   On: 2020/08/01 01:12    Procedures .Critical Care  Date/Time: Aug 01, 2020 1:59 AM Performed by: Cheryll Cockayne, MD Authorized by: Cheryll Cockayne, MD   Critical care provider statement:    Critical care time (minutes):  45   Critical care time was exclusive of:  Separately billable procedures and treating other patients   Critical care was necessary to treat or prevent imminent or life-threatening deterioration of the following conditions:  Sepsis   Critical care was time spent personally by me on the following activities:  Discussions with consultants, evaluation of patient's response to treatment, examination of patient, ordering and performing treatments and interventions, ordering  and review of laboratory studies, ordering and review of radiographic studies, pulse oximetry, re-evaluation of patient's condition, obtaining history from patient or surrogate and review of old charts   Medications Ordered in ED Medications  lactated ringers infusion (has no administration in time range)  vancomycin (VANCOCIN) IVPB 1000 mg/200 mL premix (1,000 mg Intravenous New Bag/Given August 01, 2020 0136)  lactated ringers bolus 500 mL (0 mLs Intravenous Stopped 08/01/20 0148)  piperacillin-tazobactam (ZOSYN) IVPB 3.375 g (0 g Intravenous Stopped 01-Aug-2020 0120)  acetaminophen (TYLENOL) suppository 650 mg (650 mg Rectal Given 2020-08-01 0111)  lactated ringers bolus 1,000 mL (1,000 mLs Intravenous New Bag/Given 08-01-2020 0156)    ED Course  I have reviewed the triage vital signs and the nursing notes.  Pertinent labs & imaging results that were available during my care of the patient were reviewed by me and considered in my medical decision making (see chart for details).    MDM Rules/Calculators/A&P                          Patient presents clinically suspicious for sepsis unclear source.  Sepsis protocol started.  IV fluids started.  However the patient has a history of CHF and is requiring O2 supplementation concern for fluid overload.  Lactic acid still pending.  Initial bolus of only 500 cc.  Labs show white count elevation of 22.  Sodium of 150.  Lactic acid is normal at 1.6.  I spoke with the patient's daughter Amanda Stanton, patient's wishes and advanced directives.  The patient is DNR DNI.  After lengthy discussion, family would like to pursue comfort care.   Final Clinical Impression(s) / ED Diagnoses Final diagnoses:  Sepsis, due to unspecified organism, unspecified whether acute organ dysfunction present [facility])    Rx / DC Orders ED Discharge Orders     None        Cheryll Cockayne, MD 2020/08/01 [id number]    Cheryll Cockayne, MD 08-01-2020 [phone]

## 2020-08-06 NOTE — ED Notes (Signed)
Attempted x1 for second blood culture unsuccessful at this time. Will attempt again later.

## 2020-08-06 NOTE — ED Notes (Signed)
Admitting MD notified in change of pt's VS. EKG collected. Pt started having agonal respirations greater than one minute apart, BP 67/52 and 38/25, HR has dropped to the 40s with organized rhythm. Pulse weak.

## 2020-08-06 NOTE — ED Notes (Signed)
Pt's heart rate noted to jump up to 150-160s range and monitor showing Afib. EKG completed and Afib rvr noted at this time. Admitting notified.

## 2020-08-06 NOTE — ED Notes (Signed)
X-ray at bedside

## 2020-08-06 DEATH — deceased

## 2021-08-24 IMAGING — CR DG CHEST 2V
2 series · 2 of 2 positions shown · non-contrast
Comparison: Chest x-ray 05/31/2017

CLINICAL DATA: Shortness of breath.  Wheezing.

EXAM:
CHEST - 2 VIEW.  Patient is rotated.

[chest lat]
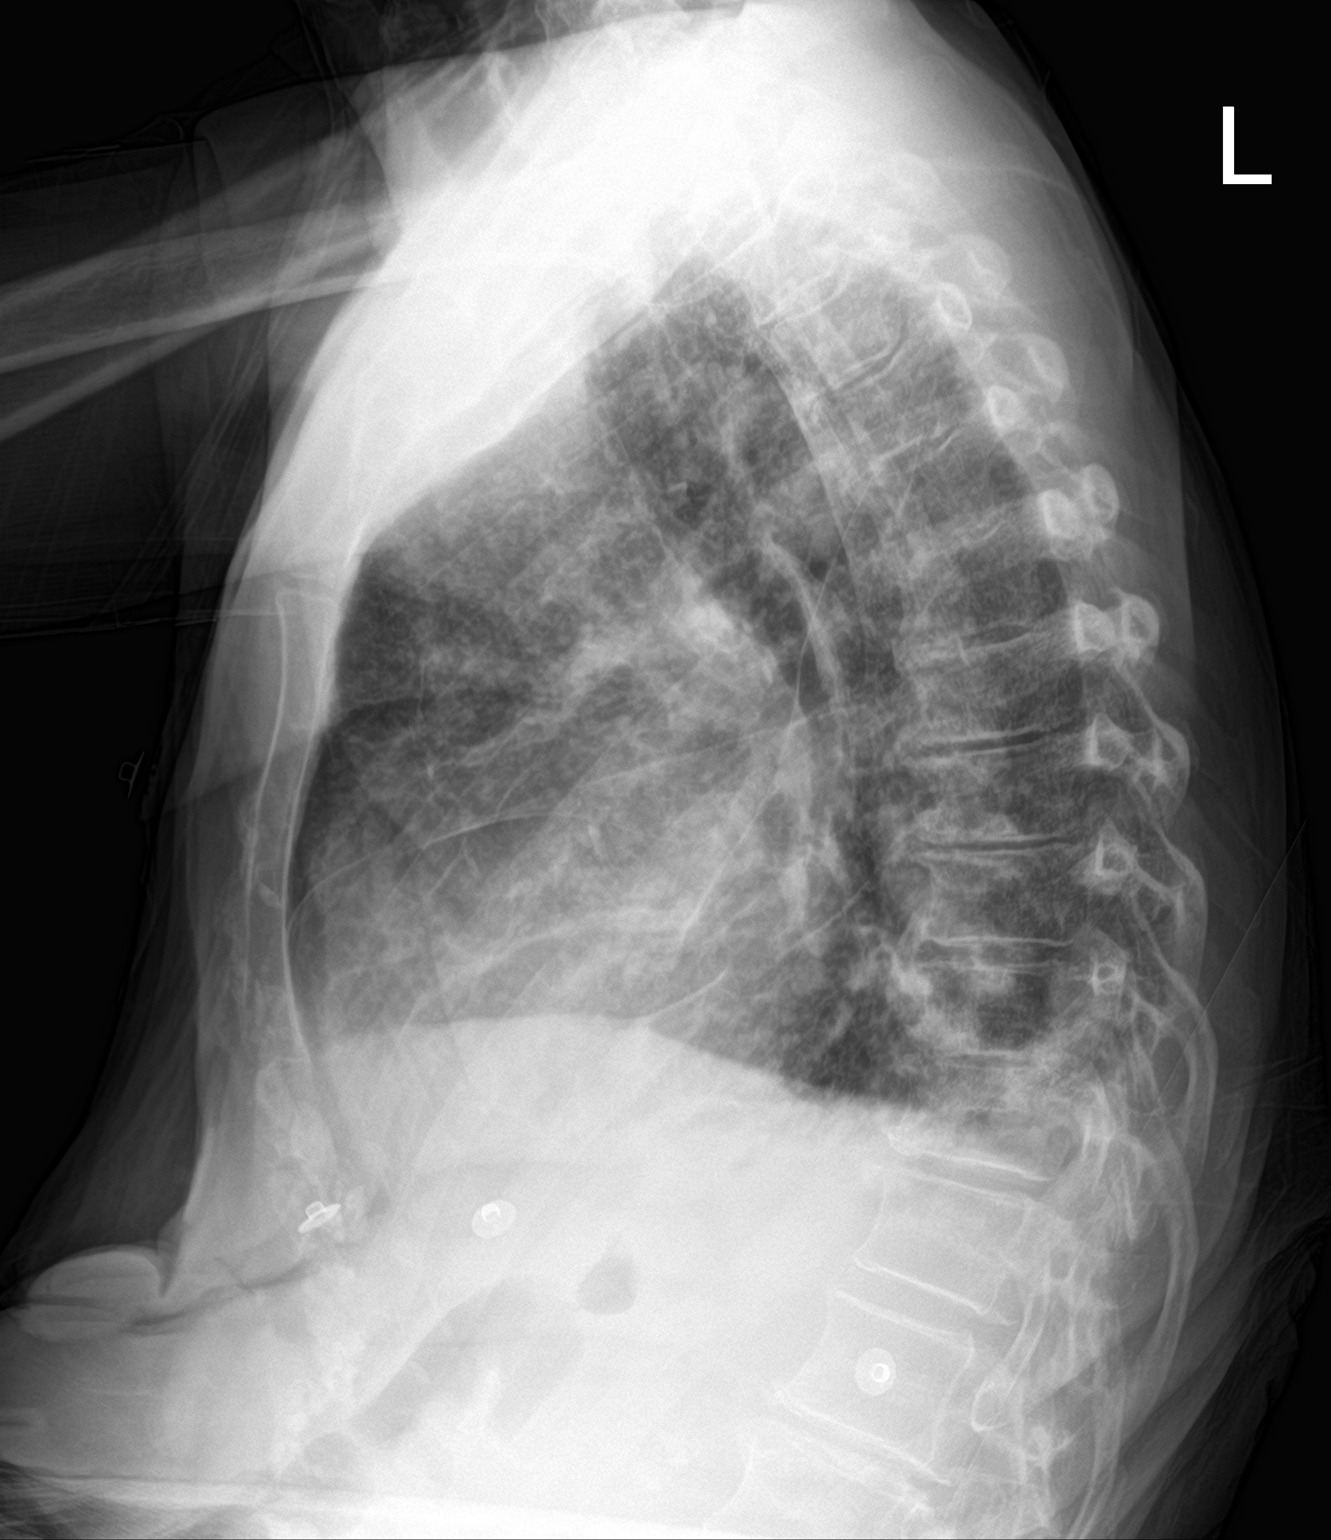

[chest ap]
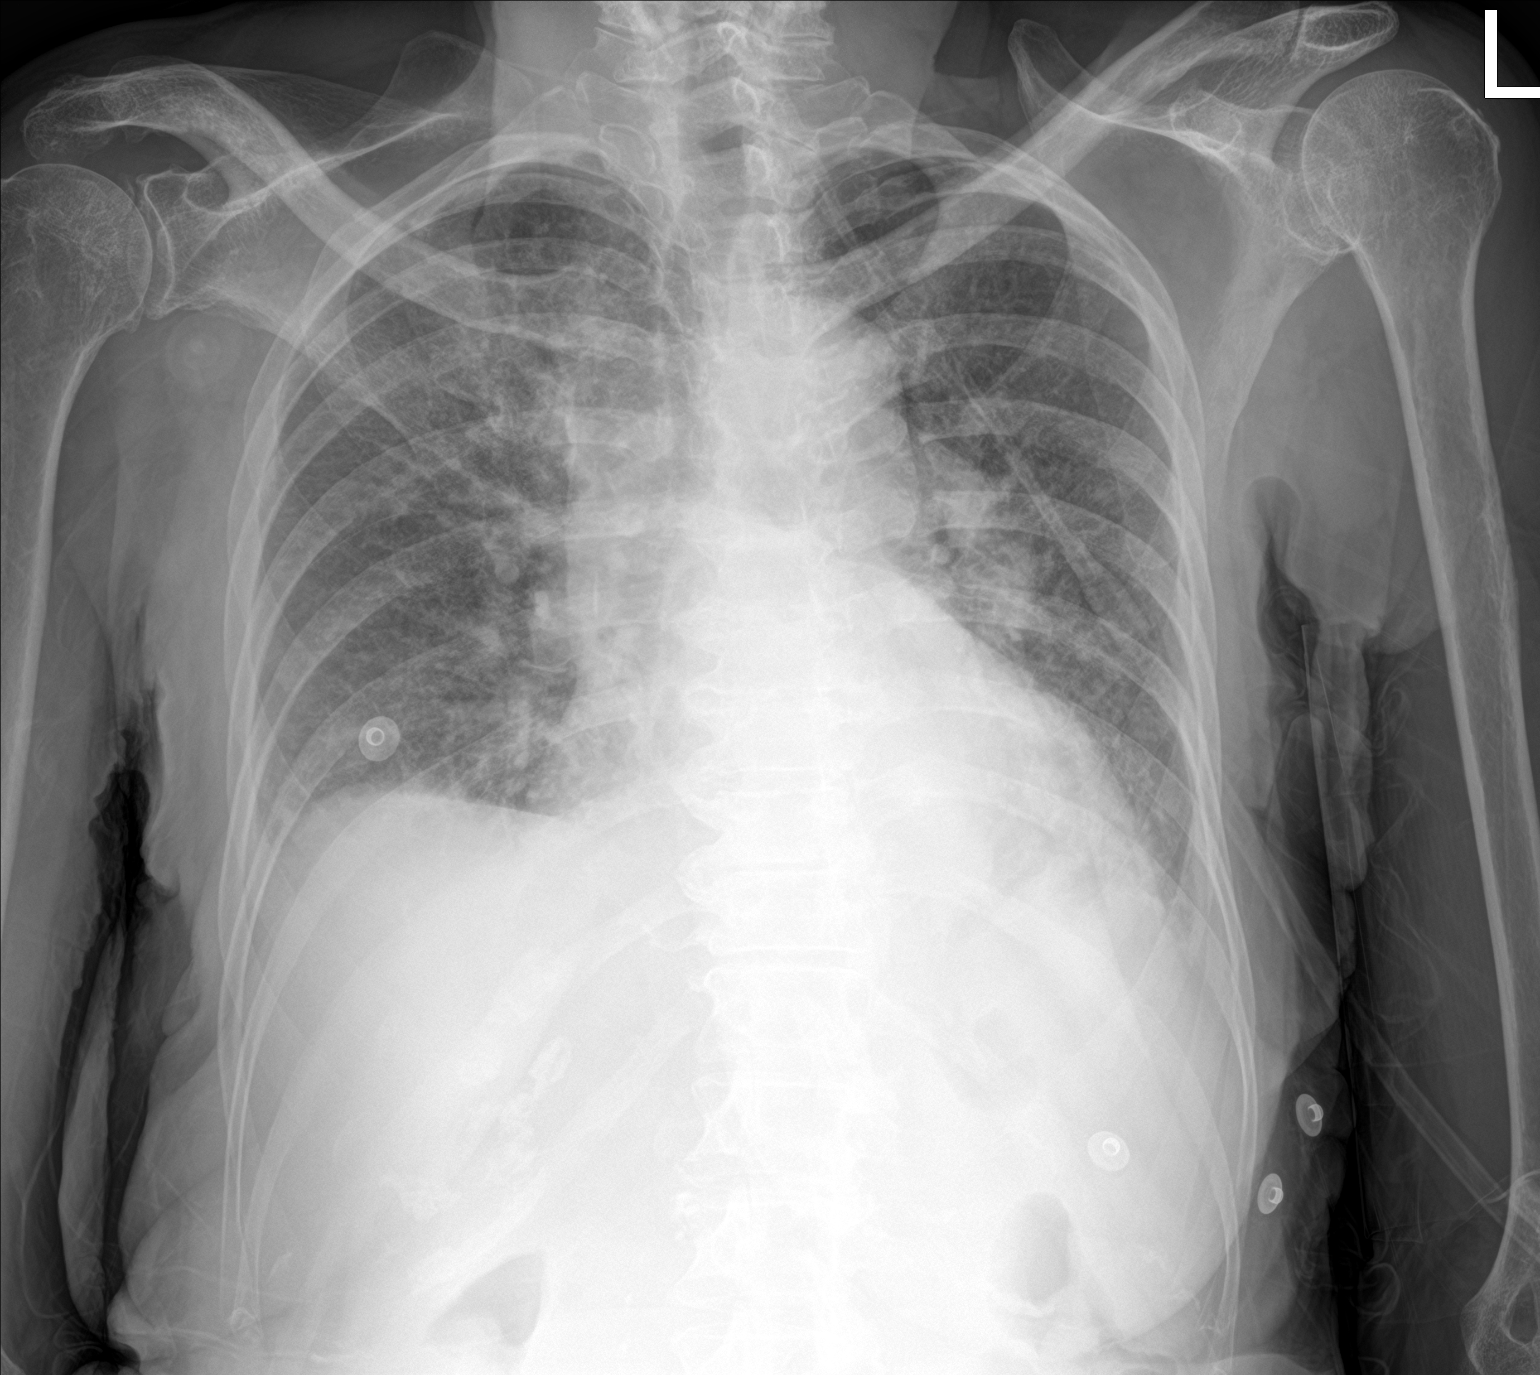

[2 of 2 positions shown; findings below may reference images not displayed]

FINDINGS: The heart size and mediastinal contours are unchanged.

No definite focal consolidation. Increased interstitial markings.
Blunting of bilateral costophrenic angles. Trace bilateral pleural
effusions. No pneumothorax.

No acute osseous abnormality.
IMPRESSION: Pulmonary edema with trace bilateral pleural effusions.
